# Patient Record
Sex: Male | Born: 1945 | Race: White | Hispanic: No | Marital: Married | Smoking: Former smoker
Health system: Southern US, Community
[De-identification: ages and names within clinical notes are randomized; demographics above are authoritative.]

## PROBLEM LIST (undated history)

## (undated) DIAGNOSIS — I10 Essential (primary) hypertension: Secondary | ICD-10-CM

## (undated) DIAGNOSIS — K625 Hemorrhage of anus and rectum: Secondary | ICD-10-CM

## (undated) DIAGNOSIS — J45909 Unspecified asthma, uncomplicated: Secondary | ICD-10-CM

## (undated) DIAGNOSIS — K5790 Diverticulosis of intestine, part unspecified, without perforation or abscess without bleeding: Secondary | ICD-10-CM

## (undated) DIAGNOSIS — I2510 Atherosclerotic heart disease of native coronary artery without angina pectoris: Secondary | ICD-10-CM

## (undated) DIAGNOSIS — K5792 Diverticulitis of intestine, part unspecified, without perforation or abscess without bleeding: Secondary | ICD-10-CM

## (undated) DIAGNOSIS — M1990 Unspecified osteoarthritis, unspecified site: Secondary | ICD-10-CM

## (undated) DIAGNOSIS — R7302 Impaired glucose tolerance (oral): Secondary | ICD-10-CM

## (undated) DIAGNOSIS — E785 Hyperlipidemia, unspecified: Secondary | ICD-10-CM

## (undated) HISTORY — PX: HERNIA REPAIR: SHX51

---

## 2006-03-17 ENCOUNTER — Ambulatory Visit: Payer: Self-pay | Admitting: Internal Medicine

## 2006-03-23 ENCOUNTER — Emergency Department: Payer: Self-pay | Admitting: Emergency Medicine

## 2007-02-28 ENCOUNTER — Ambulatory Visit: Payer: Self-pay | Admitting: Internal Medicine

## 2007-09-18 ENCOUNTER — Emergency Department: Payer: Self-pay | Admitting: Emergency Medicine

## 2007-10-20 ENCOUNTER — Ambulatory Visit: Payer: Self-pay | Admitting: General Surgery

## 2007-10-20 ENCOUNTER — Other Ambulatory Visit: Payer: Self-pay

## 2007-10-26 ENCOUNTER — Ambulatory Visit: Payer: Self-pay | Admitting: Gastroenterology

## 2007-11-02 ENCOUNTER — Ambulatory Visit: Payer: Self-pay | Admitting: General Surgery

## 2009-03-03 ENCOUNTER — Ambulatory Visit: Payer: Self-pay | Admitting: Internal Medicine

## 2009-08-15 ENCOUNTER — Ambulatory Visit: Payer: Self-pay | Admitting: Family Medicine

## 2011-06-22 ENCOUNTER — Emergency Department: Payer: Self-pay | Admitting: *Deleted

## 2013-04-10 ENCOUNTER — Ambulatory Visit: Payer: Self-pay | Admitting: Family Medicine

## 2014-12-23 ENCOUNTER — Ambulatory Visit (INDEPENDENT_AMBULATORY_CARE_PROVIDER_SITE_OTHER)
Admission: EM | Admit: 2014-12-23 | Discharge: 2014-12-23 | Disposition: A | Discharge status: Home / Self Care | Payer: 59 | Source: Home / Self Care

## 2014-12-23 ENCOUNTER — Inpatient Hospital Stay
Admission: EM | Admit: 2014-12-23 | Discharge: 2014-12-24 | DRG: 379 | Disposition: A | Discharge status: Other Acute Inpatient Hospital | Payer: 59 | Attending: Internal Medicine | Admitting: Internal Medicine

## 2014-12-23 ENCOUNTER — Encounter: Payer: Self-pay | Admitting: *Deleted

## 2014-12-23 DIAGNOSIS — I878 Other specified disorders of veins: Secondary | ICD-10-CM | POA: Insufficient documentation

## 2014-12-23 DIAGNOSIS — W19XXXA Unspecified fall, initial encounter: Secondary | ICD-10-CM

## 2014-12-23 DIAGNOSIS — Z8249 Family history of ischemic heart disease and other diseases of the circulatory system: Secondary | ICD-10-CM

## 2014-12-23 DIAGNOSIS — K922 Gastrointestinal hemorrhage, unspecified: Secondary | ICD-10-CM | POA: Diagnosis not present

## 2014-12-23 DIAGNOSIS — I959 Hypotension, unspecified: Secondary | ICD-10-CM | POA: Diagnosis present

## 2014-12-23 DIAGNOSIS — K625 Hemorrhage of anus and rectum: Secondary | ICD-10-CM | POA: Diagnosis not present

## 2014-12-23 DIAGNOSIS — J45909 Unspecified asthma, uncomplicated: Secondary | ICD-10-CM | POA: Diagnosis present

## 2014-12-23 DIAGNOSIS — E785 Hyperlipidemia, unspecified: Secondary | ICD-10-CM | POA: Diagnosis present

## 2014-12-23 DIAGNOSIS — Z8719 Personal history of other diseases of the digestive system: Secondary | ICD-10-CM

## 2014-12-23 DIAGNOSIS — R571 Hypovolemic shock: Secondary | ICD-10-CM

## 2014-12-23 DIAGNOSIS — R55 Syncope and collapse: Secondary | ICD-10-CM | POA: Diagnosis present

## 2014-12-23 DIAGNOSIS — Z452 Encounter for adjustment and management of vascular access device: Secondary | ICD-10-CM

## 2014-12-23 DIAGNOSIS — I2510 Atherosclerotic heart disease of native coronary artery without angina pectoris: Secondary | ICD-10-CM | POA: Diagnosis present

## 2014-12-23 DIAGNOSIS — I10 Essential (primary) hypertension: Secondary | ICD-10-CM | POA: Diagnosis present

## 2014-12-23 DIAGNOSIS — K921 Melena: Principal | ICD-10-CM | POA: Diagnosis present

## 2014-12-23 HISTORY — DX: Essential (primary) hypertension: I10

## 2014-12-23 HISTORY — DX: Hyperlipidemia, unspecified: E78.5

## 2014-12-23 HISTORY — DX: Diverticulosis of intestine, part unspecified, without perforation or abscess without bleeding: K57.90

## 2014-12-23 HISTORY — DX: Diverticulitis of intestine, part unspecified, without perforation or abscess without bleeding: K57.92

## 2014-12-23 LAB — COMPREHENSIVE METABOLIC PANEL
ALT: 38 U/L (ref 17–63)
AST: 35 U/L (ref 15–41)
Albumin: 3.7 g/dL (ref 3.5–5.0)
Alkaline Phosphatase: 47 U/L (ref 38–126)
Anion gap: 6 (ref 5–15)
BUN: 19 mg/dL (ref 6–20)
CHLORIDE: 105 mmol/L (ref 101–111)
CO2: 30 mmol/L (ref 22–32)
CREATININE: 0.85 mg/dL (ref 0.61–1.24)
Calcium: 9.4 mg/dL (ref 8.9–10.3)
GFR calc non Af Amer: >60 mL/min (ref >60)
Glucose, Bld: 117 mg/dL — ABNORMAL HIGH (ref 65–99)
Potassium: 3.9 mmol/L (ref 3.5–5.1)
SODIUM: 141 mmol/L (ref 135–145)
Total Bilirubin: 0.7 mg/dL (ref 0.3–1.2)
Total Protein: 6.1 g/dL — ABNORMAL LOW (ref 6.5–8.1)

## 2014-12-23 LAB — CBC
HCT: 40.6 % (ref 40.0–52.0)
Hemoglobin: 13.4 g/dL (ref 13.0–18.0)
MCH: 29.9 pg (ref 26.0–34.0)
MCHC: 33.1 g/dL (ref 32.0–36.0)
MCV: 90.4 fL (ref 80.0–100.0)
PLATELETS: 209 10*3/uL (ref 150–440)
RBC: 4.49 MIL/uL (ref 4.40–5.90)
RDW: 13.5 % (ref 11.5–14.5)
WBC: 12 10*3/uL — ABNORMAL HIGH (ref 3.8–10.6)

## 2014-12-23 LAB — ABO/RH: ABO/RH(D): O POS

## 2014-12-23 LAB — HEMOGLOBIN: HEMOGLOBIN: 13.1 g/dL (ref 13.0–18.0)

## 2014-12-23 MED ORDER — ADULT MULTIVITAMIN W/MINERALS CH: 1.0000 | ORAL_TABLET | ORAL | Status: DC

## 2014-12-23 MED ORDER — SODIUM CHLORIDE 0.9 % IV BOLUS (SEPSIS)
1000.0000 mL | Freq: Once | INTRAVENOUS | Status: AC
  Administered 2014-12-23: 1000 mL via INTRAVENOUS

## 2014-12-23 MED ORDER — VITAMIN C 500 MG PO TABS
1000.0000 mg | ORAL_TABLET | Freq: Every day | ORAL | Status: DC
Start: 2014-12-23 — End: 2014-12-24

## 2014-12-23 MED ORDER — ONDANSETRON HCL 4 MG/2ML IJ SOLN
4.0000 mg | Freq: Four times a day (QID) | INTRAMUSCULAR | Status: DC | PRN
  Administered 2014-12-24: 4 mg via INTRAVENOUS
  Filled 2014-12-23: qty 2

## 2014-12-23 MED ORDER — ACETAMINOPHEN 325 MG PO TABS: 650.0000 mg | ORAL_TABLET | Freq: Four times a day (QID) | ORAL | Status: DC | PRN

## 2014-12-23 MED ORDER — SODIUM CHLORIDE 0.9 % IV SOLN
INTRAVENOUS | Status: DC
  Administered 2014-12-23: 21:00:00 via INTRAVENOUS

## 2014-12-23 MED ORDER — PRAVASTATIN SODIUM 20 MG PO TABS: 20.0000 mg | ORAL_TABLET | Freq: Every day | ORAL | Status: DC

## 2014-12-23 MED ORDER — ACETAMINOPHEN 650 MG RE SUPP: 650.0000 mg | Freq: Four times a day (QID) | RECTAL | Status: DC | PRN

## 2014-12-23 MED ORDER — SODIUM CHLORIDE 0.9 % IV BOLUS (SEPSIS): 1000.0000 mL | INTRAVENOUS | Status: DC

## 2014-12-23 MED ORDER — MOMETASONE FURO-FORMOTEROL FUM 100-5 MCG/ACT IN AERO
2.0000 | INHALATION_SPRAY | Freq: Two times a day (BID) | RESPIRATORY_TRACT | Status: DC
  Filled 2014-12-23: qty 8.8

## 2014-12-23 MED ORDER — ONDANSETRON HCL 4 MG PO TABS
4.0000 mg | ORAL_TABLET | Freq: Four times a day (QID) | ORAL | Status: DC | PRN
Start: 2014-12-23 — End: 2014-12-24

## 2014-12-23 MED ORDER — ONDANSETRON HCL 4 MG/2ML IJ SOLN
4.0000 mg | Freq: Once | INTRAMUSCULAR | Status: AC
  Administered 2014-12-23: 4 mg via INTRAVENOUS

## 2014-12-23 NOTE — ED Provider Notes (Addendum)
[facility] Emergency Department Provider Note  ____________________________________________  Time seen: Approximately 6:29 PM  I have reviewed the triage vital signs and the nursing notes.   HISTORY  Chief Complaint Rectal Bleeding    HPI Shawn Lowery is a 69 y.o. male with a past medical history includes prior diverticulitis who presents with acute onset of gross blood per rectum.  He states he felt like he was in his normal state of health this afternoon and then took a brief nap.  When he awoke he felt like he was going have diarrhea so he went to the bathroom and had a large volume of blood in stool.  This happened 2 more times over a short period of time when he asked his wife to take him to the urgent care.  After he arrived at the urgent care he was being evaluated when he became very diaphoretic and reportedly became hypoxemic (it sounds like he may have had a vasovagal episode).  Transported him to [facility] by EMS.  Patient states he has had at least 8 episodes of gross bright red blood per rectum since it started this afternoon including several episodes here in the emergency department.  Onset is acute severity is severe.  He feels lightheaded and dizzy when he stands up.  He denies chest pain, shortness of breath, nausea, vomiting.  He is having intermittent cramping abdominal pain but it is currently absent and is only mild at its most severe.  He has never had Bleeding like this in the past, although his wife reports that his last endoscopy showed diverticulosis.   Past Medical History  Diagnosis Date  . Hypertension   . Hyperlipemia     There are no active problems to display for this patient.   History reviewed. No pertinent past surgical history.  Current Outpatient Rx  Name  Route  Sig  Dispense  Refill  . Fluticasone-Salmeterol (ADVAIR) 100-50 MCG/DOSE AEPB   Inhalation   Inhale 1 puff into the lungs 2 (two) times  daily.         Marland Kitchen lisinopril-hydrochlorothiazide (PRINZIDE,ZESTORETIC) 20-25 MG per tablet   Oral   Take 1 tablet by mouth daily.         Marland Kitchen lovastatin (MEVACOR) 20 MG tablet   Oral   Take 20 mg by mouth at bedtime.           Allergies Review of patient's allergies indicates no known allergies.  History reviewed. No pertinent family history.  Social History Social History  Substance Use Topics  . Smoking status: Former Research scientist (life sciences)  . Smokeless tobacco: Never Used  . Alcohol Use: No    Review of Systems Constitutional: No fever/chills Eyes: No visual changes. ENT: No sore throat. Cardiovascular: Denies chest pain. Respiratory: Denies shortness of breath. Gastrointestinal: Intermittent mild lower abdominal cramping.  No nausea, no vomiting.  Profuse diarrhea that is mostly blood  Genitourinary: Negative for dysuria. Musculoskeletal: Negative for back pain. Skin: Negative for rash. Neurological: Negative for headaches, focal weakness or numbness.  Feeling lightheaded and weak when he stands up  10-point ROS otherwise negative.  ____________________________________________   PHYSICAL EXAM:  VITAL SIGNS: ED Triage Vitals  Enc Vitals Group     BP 12/23/14 1646 130/73 mmHg     Pulse Rate 12/23/14 1646 65     Resp 12/23/14 1646 16     Temp --      Temp src --      SpO2  12/23/14 1642 96 %     Weight 12/23/14 1646 200 lb (90.719 kg)     Height 12/23/14 1646 5\' 11"  (1.803 m)     Head Cir --      Peak Flow --      Pain Score --      Pain Loc --      Pain Edu? --      Excl. in [place]? --     Constitutional: Alert and oriented. Well appearing and in no acute distress at this time. Eyes: Conjunctivae are normal. PERRL. EOMI. Head: Atraumatic. Nose: No congestion/rhinnorhea. Mouth/Throat: Mucous membranes are moist.  Oropharynx non-erythematous. Neck: No stridor.   Cardiovascular: Normal rate, regular rhythm. Grossly normal heart sounds.  Good peripheral  circulation. Respiratory: Normal respiratory effort.  No retractions. Lungs CTAB. Gastrointestinal: Soft and nontender. No distention. No abdominal bruits. No CVA tenderness. Deferred rectal exam because he had a grossly bloody bowel movement (BRBPR w/ dark clots) while in the ED. Musculoskeletal: No lower extremity tenderness nor edema.  No joint effusions. Neurologic:  Normal speech and language. No gross focal neurologic deficits are appreciated.  Skin:  Skin is warm, dry and intact. No rash noted. Psychiatric: Mood and affect are normal. Speech and behavior are normal.  ____________________________________________   LABS (all labs ordered are listed, but only abnormal results are displayed)  Labs Reviewed  COMPREHENSIVE METABOLIC PANEL - Abnormal; Notable for the following:    Glucose, Bld 117 (*)    Total Protein 6.1 (*)    All other components within normal limits  CBC - Abnormal; Notable for the following:    WBC 12.0 (*)    All other components within normal limits  TYPE AND SCREEN  ABO/RH   ____________________________________________  EKG  ED ECG REPORT I, Lucetta Baehr, the attending physician, personally viewed and interpreted this ECG.  Date: 12/23/2014 EKG Time: 16:49 Rate: 68 Rhythm: normal sinus rhythm QRS Axis: normal Intervals: normal ST/T Wave abnormalities: normal Conduction Disutrbances: none Narrative Interpretation: unremarkable  ____________________________________________  RADIOLOGY  No results found.  ____________________________________________   PROCEDURES  Procedure(s) performed: None  Critical Care performed: No ____________________________________________   INITIAL IMPRESSION / ASSESSMENT AND PLAN / ED COURSE  Pertinent labs & imaging results that were available during my care of the patient were reviewed by me and considered in my medical decision making (see chart for details).  Gross blood per rectum with lightheadedness  and dizziness.  Initial hemoglobin is normal but that is to be expected given the acute onset of the bleeding.  Patient has 2 peripheral IVs and is getting normal saline bolus.  Standard labs up and ordered including type and screen.  He is continued to have bloody bowel movements but is otherwise asymptomatic as long as he does not stand up too quickly.  We will admit for further evaluation and management.  ____________________________________________  FINAL CLINICAL IMPRESSION(S) / ED DIAGNOSES  Final diagnoses:  Acute lower GI bleeding      NEW MEDICATIONS STARTED DURING THIS VISIT:  New Prescriptions   No medications on file      Hinda Kehr, MD 12/23/14 1835   Note: 2L NS IV boluses were started at urgent care...he continued them in the ED.  Hinda Kehr, MD 12/23/14 Bosie Helper

## 2014-12-23 NOTE — ED Notes (Signed)
1000 ml NS completed

## 2014-12-23 NOTE — H&P (Signed)
[place] [facility] [place] NAME: Shawn Lowery    MR#:  [id number]  DATE OF BIRTH:  1946/02/21  DATE OF ADMISSION:  12/23/2014  PRIMARY CARE PHYSICIAN: Sallee Lange, NP   REQUESTING/REFERRING PHYSICIAN: Dr. Hinda Kehr  CHIEF COMPLAINT:   Chief Complaint  Patient presents with  . Rectal Bleeding    HISTORY OF PRESENT ILLNESS:  Shawn Lowery  is a 69 y.o. male with a known history of hypertension, hyperlipidemia, who presents to the hospital due to multiple episodes of bright red blood per rectum. Patient has never had these symptoms before. He has had about 7-8 bloody bowel movements today. Patient went to urgent care and had a presyncopal event there and therefore was referred to the ER for further evaluation. Patient denies any abdominal pain, nausea, vomiting, fever, chills or any other associated symptoms. Patient does have a history of diverticulosis and diverticulitis in the past.  PAST MEDICAL HISTORY:   Past Medical History  Diagnosis Date  . Hypertension   . Hyperlipemia   . Diverticulosis   . Diverticulitis     PAST SURGICAL HISTORY:  History reviewed. No pertinent past surgical history.  SOCIAL HISTORY:   Social History  Substance Use Topics  . Smoking status: Former Smoker -- 1.00 packs/day for 15 years  . Smokeless tobacco: Never Used  . Alcohol Use: No    FAMILY HISTORY:   Family History  Problem Relation Age of Onset  . Hypertension Father     DRUG ALLERGIES:  No Known Allergies  REVIEW OF SYSTEMS:   Review of Systems  Constitutional: Negative for fever and weight loss.  HENT: Negative for congestion, nosebleeds and tinnitus.   Eyes: Negative for blurred vision, double vision and redness.  Respiratory: Negative for cough, hemoptysis and shortness of breath.   Cardiovascular: Negative for chest pain, orthopnea, leg swelling and PND.  Gastrointestinal: Positive for blood in stool. Negative for  nausea, vomiting, abdominal pain, diarrhea and melena.  Genitourinary: Negative for dysuria, urgency and hematuria.  Musculoskeletal: Negative for joint pain and falls.  Neurological: Negative for dizziness, tingling, sensory change, focal weakness, seizures, weakness and headaches.  Endo/Heme/Allergies: Negative for polydipsia. Does not bruise/bleed easily.  Psychiatric/Behavioral: Negative for depression and memory loss. The patient is not nervous/anxious.     MEDICATIONS AT HOME:   Prior to Admission medications   Medication Sig Start Date End Date Taking? Authorizing Provider  Ascorbic Acid (VITAMIN C) 1000 MG tablet Take 1,000 mg by mouth daily.   Yes Historical Provider, MD  aspirin EC 81 MG tablet Take 81 mg by mouth daily.   Yes Historical Provider, MD  Fluticasone-Salmeterol (ADVAIR) 100-50 MCG/DOSE AEPB Inhale 1 puff into the lungs 2 (two) times daily.   Yes Historical Provider, MD  lisinopril-hydrochlorothiazide (PRINZIDE,ZESTORETIC) 20-25 MG per tablet Take 1 tablet by mouth daily.   Yes Historical Provider, MD  lovastatin (MEVACOR) 20 MG tablet Take 20 mg by mouth daily.    Yes Historical Provider, MD  Multiple Vitamin (MULTIVITAMIN WITH MINERALS) TABS tablet Take 1 tablet by mouth every Monday, Wednesday, and Friday.   Yes Historical Provider, MD      VITAL SIGNS:  Blood pressure 134/63, pulse 69, resp. rate 18, height 5\' 11"  (1.803 m), weight 90.719 kg (200 lb), SpO2 98 %.  PHYSICAL EXAMINATION:  Physical Exam  GENERAL:  69 y.o.-year-old patient lying in the bed with no acute distress.  EYES: Pupils equal, round, reactive to light and  accommodation. No scleral icterus. Extraocular muscles intact.  HEENT: Head atraumatic, normocephalic. Oropharynx and nasopharynx clear. No oropharyngeal erythema, moist oral mucosa  NECK:  Supple, no jugular venous distention. No thyroid enlargement, no tenderness.  LUNGS: Normal breath sounds bilaterally, no wheezing, rales, rhonchi. No  use of accessory muscles of respiration.  CARDIOVASCULAR: S1, S2 RRR. No murmurs, rubs, gallops, clicks.  ABDOMEN: Soft, nontender, nondistended. Bowel sounds present. No organomegaly or mass.  EXTREMITIES: No pedal edema, cyanosis, or clubbing. + 2 pedal & radial pulses b/l.   NEUROLOGIC: Cranial nerves II through XII are intact. No focal Motor or sensory deficits appreciated b/l PSYCHIATRIC: The patient is alert and oriented x 3. Good affect.  SKIN: No obvious rash, lesion, or ulcer.   LABORATORY PANEL:   CBC  Recent Labs Lab 12/23/14 1658  WBC 12.0*  HGB 13.4  HCT 40.6  PLT 209   ------------------------------------------------------------------------------------------------------------------  Chemistries   Recent Labs Lab 12/23/14 1658  NA 141  K 3.9  CL 105  CO2 30  GLUCOSE 117*  BUN 19  CREATININE 0.85  CALCIUM 9.4  AST 35  ALT 38  ALKPHOS 47  BILITOT 0.7   ------------------------------------------------------------------------------------------------------------------  Cardiac Enzymes No results for input(s): TROPONINI in the last 168 hours. ------------------------------------------------------------------------------------------------------------------  RADIOLOGY:  No results found.   IMPRESSION AND PLAN:   69 year old male with past medical history of hypertension, hyperlipidemia, history of diverticulosis, asthma, history of coronary disease, who presents to the hospital due to multiple episodes of rectal bleeding.  #1 GI bleed-likely a lower GI bleed given his hematochezia. -His hemoglobin although is stable. He is hemodynamically stable. -We'll observe overnight, follow serial hemoglobin. Most likely cause of this is diverticular given his history. -Gastroenterology consult. Hold aspirin. Transfuse if needed.  #2 hypertension-hold antihypertensives given the patient's GI bleed.  #3 hyperlipidemia-continue Pravachol  #4 asthma-continue  Symbicort.   All the records are reviewed and case discussed with ED provider. Management plans discussed with the patient, family and they are in agreement.  CODE STATUS: Full  TOTAL TIME TAKING CARE OF THIS PATIENT: 45 minutes.    Henreitta Leber M.D on 12/23/2014 at 8:26 PM  Between 7am to 6pm - Pager - [phone]  After 6pm go to [email or web address] - password [email or web address] [facility]  Office  [phone]  CC: Primary care physician; Sallee Lange, NP

## 2014-12-23 NOTE — ED Notes (Signed)
Dr. Alveta Heimlich request that EMS be called as patient extremely diaphoretic and pale in color.

## 2014-12-23 NOTE — Discharge Instructions (Signed)
Gastrointestinal Bleeding Gastrointestinal bleeding is bleeding somewhere along the path that food travels through the body (digestive tract). This path is anywhere between the mouth and the opening of the butt (anus). You may have blood in your throw up (vomit) or in your poop (stools). If there is a lot of bleeding, you may need to stay in the hospital. [place]  Only take medicine as told by your doctor.  Eat foods with fiber such as whole grains, fruits, and vegetables. You can also try eating 1 to 3 prunes a day.  Drink enough fluids to keep your pee (urine) clear or pale yellow. GET HELP RIGHT AWAY IF:   Your bleeding gets worse.  You feel dizzy, weak, or you pass out (faint).  You have bad cramps in your back or belly (abdomen).  You have large blood clumps (clots) in your poop.  Your problems are getting worse. MAKE SURE YOU:   Understand these instructions.  Will watch your condition.  Will get help right away if you are not doing well or get worse. Document Released: 01/23/2008 Document Revised: 04/01/2012 Document Reviewed: 03/25/2011 [facility] Patient Information 2015 [place], [place]. This information is not intended to replace advice given to you by your health care provider. Make sure you discuss any questions you have with your health care provider.  Rectal Bleeding  Rectal bleeding is when blood comes out of the opening of the butt (anus). Rectal bleeding may show up as bright red blood or really dark poop (stool). The poop may look dark red, maroon, or black. Rectal bleeding is often a sign that something is wrong. This needs to be checked by a doctor.  HOME CARE  Eat a diet high in fiber. This will help keep your poop soft.  Limit activity.  Drink enough fluids to keep your pee (urine) clear or pale yellow.  Take a warm bath to soothe any pain.  Follow up with your doctor as told. GET HELP RIGHT AWAY IF:  You have more bleeding.  You have black or dark  red poop.  You throw up (vomit) blood or it looks like coffee grounds.  You have belly (abdominal) pain or tenderness.  You have a fever.  You feel weak, sick to your stomach (nauseous), or you pass out (faint).  You have pain that is so bad you cannot poop (bowel movement). MAKE SURE YOU:  Understand these instructions.  Will watch your condition.  Will get help right away if you are not doing well or get worse. Document Released: 12/26/2010 Document Revised: 08/30/2013 Document Reviewed: 12/26/2010 [facility] Patient Information 2015 [place], [place]. This information is not intended to replace advice given to you by your health care provider. Make sure you discuss any questions you have with your health care provider.

## 2014-12-23 NOTE — ED Notes (Signed)
Patient take by EMS to [facility] ED.  Dr. Alveta Heimlich called report to charge nurse at [facility] ED.

## 2014-12-23 NOTE — ED Notes (Signed)
EMS recalled as patient status decline, more diaphoretic, extremely pale, and lethargic.

## 2014-12-23 NOTE — ED Notes (Signed)
Pt sent from [facility] Urgent care, c/o rectal bleeding.  Urgent care started 2 PIV, with NS 2L, Pt had near syncopal episode.

## 2014-12-23 NOTE — ED Notes (Signed)
Pt states "I started bleeding from my rectum about 30 mins ago." Dr. Alveta Heimlich called to bedside. Pt states "I do take an Asprin and usually have high blood pressure. I do take blood pressure medication."

## 2014-12-23 NOTE — ED Provider Notes (Signed)
CSN: [id number]     Arrival date & time 12/23/14  1519 History   None    Chief Complaint  Patient presents with  . Rectal Bleeding   (Consider location/radiation/quality/duration/timing/severity/associated sxs/prior Treatment) Patient is a 69 y.o. male presenting with hematochezia. The history is provided by the patient and the spouse. No language interpreter was used.  Rectal Bleeding Quality:  Bright red Amount:  Copious Timing:  Intermittent Progression:  Worsening Chronicity:  New Context: defecation and spontaneously   Context: not diarrhea, not foreign body, not hemorrhoids, not rectal injury and not rectal pain   Similar prior episodes: no   Relieved by:  Nothing Worsened by:  Defecation Ineffective treatments:  None tried Associated symptoms: dizziness and light-headedness   Associated symptoms: no abdominal pain, no epistaxis, no fever, no hematemesis, no loss of consciousness, no recent illness and no vomiting   Associated symptoms comment:  Patient does have nausea. Risk factors: NSAID use   Risk factors: no anticoagulant use, no hx of colorectal cancer, no hx of colorectal surgery and no hx of IBD      Patient is a 69 year old white male who presented to the urgent care course rectal bleeding at home that started about an hour ago. We initially checked in his bathroom for about 20 minutes when they checked on him and found blood all the bathroom he was alert and coherent but is stable to the back with the nurse came and got me. Patient reports last colonoscopy was about 9 years ago and he does have a history of diverticulitis states that using occurs he'll have some abdominal pain as well. Takes baby aspirin but denies being on any blood thinners Otherwise.    No past medical history on file. No past surgical history on file. No family history on file. Social History  Substance Use Topics  . Smoking status: Not on file  . Smokeless tobacco: Not on file  .  Alcohol Use: Not on file    Review of Systems  Constitutional: Positive for diaphoresis, activity change and fatigue. Negative for fever.  HENT: Negative for nosebleeds.   Respiratory: Negative for apnea, chest tightness and shortness of breath.   Cardiovascular: Negative for chest pain and palpitations.  Gastrointestinal: Positive for nausea, diarrhea, blood in stool, hematochezia and anal bleeding. Negative for vomiting, abdominal pain, rectal pain and hematemesis.  Neurological: Positive for dizziness and light-headedness. Negative for loss of consciousness.  All other systems reviewed and are negative.   Allergies  Review of patient's allergies indicates not on file.  Home Medications   Prior to Admission medications   Medication Sig Start Date End Date Taking? Authorizing Provider  Fluticasone-Salmeterol (ADVAIR) 100-50 MCG/DOSE AEPB Inhale 1 puff into the lungs 2 (two) times daily.   Yes Historical Provider, MD  lisinopril-hydrochlorothiazide (PRINZIDE,ZESTORETIC) 20-25 MG per tablet Take 1 tablet by mouth daily.   Yes Historical Provider, MD  lovastatin (MEVACOR) 20 MG tablet Take 20 mg by mouth at bedtime.   Yes Historical Provider, MD   Meds Ordered and Administered this Visit   Medications  sodium chloride 0.9 % bolus 1,000 mL (1,000 mLs Intravenous Given 12/23/14 1758)  sodium chloride 0.9 % bolus 1,000 mL (1,000 mLs Intravenous Given 12/23/14 1755)  ondansetron (ZOFRAN) injection 4 mg (4 mg Intravenous Given 12/23/14 1600)  According to his wife he does not smoke.  BP 114/94 mmHg  Pulse 64  Temp(Src) 98.4 F (36.9 C) (Oral)  Resp 24  SpO2 100% No data found.  Physical Exam  Constitutional: He appears well-developed and well-nourished. He is not intubated.  HENT:  Head: Normocephalic and atraumatic.  Eyes: Pupils are equal, round, and reactive to light.  Neck: Normal range of motion. Neck supple.  Cardiovascular: Regular rhythm and S1 normal.  Tachycardia  present.   Pulmonary/Chest: Accessory muscle usage present. He is not intubated. No respiratory distress. He has decreased breath sounds. He has no wheezes. He has no rhonchi.  Patient initially noted to have a low pulse ox 90-93 he was started on 2 L and then eventually moved 4 L. While dictating a nonrebreathing mask and IV fluids started and pulse ox improved and patient was able to wean down to 94 L when time EMS came  Abdominal: Soft. Normal appearance. Bowel sounds are decreased. There is no hepatosplenomegaly. There is tenderness in the left lower quadrant. There is no CVA tenderness.    Skin: Skin is intact. No rash noted. He is diaphoretic.  Psychiatric: He has a normal mood and affect. His behavior is normal. His mood appears not anxious. His speech is not delayed and not slurred. He is not agitated, not aggressive and not slowed. Cognition and memory are normal.    ED Course  Procedures (including critical care time)  Labs Review Labs Reviewed - No data to display  Imaging Review No results found.   Visual Acuity Review  Right Eye Distance:   Left Eye Distance:   Bilateral Distance:    Right Eye Near:   Left Eye Near:    Bilateral Near:         MDM   1. Hypovolemic shock   2. Rectal bleeding   3. Hx of diverticulitis of colon    While patient was being evaluated he became diaphoretic, pulse ox dropped, and he became tachycardic. Two lines were started as well as O2 and fluids were pushed in. Patient became more alert blood pressure improved and pulse rate and oxygenation also improved. EMS arrived and transported patient to [facility]. Discharge disc regular was given reports During this time Dr. Alveta Heimlich stated patient's bedside until arrival of EMS.     Frederich Cha, MD 12/23/14 [phone]

## 2014-12-24 ENCOUNTER — Other Ambulatory Visit: Payer: Self-pay | Admitting: Internal Medicine

## 2014-12-24 ENCOUNTER — Other Ambulatory Visit: Payer: Self-pay

## 2014-12-24 ENCOUNTER — Observation Stay: Payer: 59

## 2014-12-24 ENCOUNTER — Ambulatory Visit (HOSPITAL_COMMUNITY)
Admission: RE | Admit: 2014-12-24 | Discharge: 2014-12-24 | Disposition: A | Discharge status: Home / Self Care | Payer: 59 | Source: Ambulatory Visit | Attending: Internal Medicine | Admitting: Internal Medicine

## 2014-12-24 ENCOUNTER — Inpatient Hospital Stay: Payer: 59

## 2014-12-24 ENCOUNTER — Encounter (HOSPITAL_COMMUNITY): Payer: Self-pay | Admitting: *Deleted

## 2014-12-24 ENCOUNTER — Inpatient Hospital Stay (HOSPITAL_COMMUNITY)
Admission: AD | Admit: 2014-12-24 | Discharge: 2014-12-27 | DRG: 377 | Disposition: A | Discharge status: Home / Self Care | Payer: 59 | Source: Other Acute Inpatient Hospital | Attending: Internal Medicine | Admitting: Internal Medicine

## 2014-12-24 DIAGNOSIS — J45909 Unspecified asthma, uncomplicated: Secondary | ICD-10-CM | POA: Diagnosis present

## 2014-12-24 DIAGNOSIS — K921 Melena: Secondary | ICD-10-CM | POA: Diagnosis present

## 2014-12-24 DIAGNOSIS — E785 Hyperlipidemia, unspecified: Secondary | ICD-10-CM | POA: Diagnosis present

## 2014-12-24 DIAGNOSIS — K5791 Diverticulosis of intestine, part unspecified, without perforation or abscess with bleeding: Principal | ICD-10-CM | POA: Diagnosis present

## 2014-12-24 DIAGNOSIS — Z87891 Personal history of nicotine dependence: Secondary | ICD-10-CM

## 2014-12-24 DIAGNOSIS — K5731 Diverticulosis of large intestine without perforation or abscess with bleeding: Secondary | ICD-10-CM | POA: Diagnosis present

## 2014-12-24 DIAGNOSIS — K922 Gastrointestinal hemorrhage, unspecified: Secondary | ICD-10-CM | POA: Insufficient documentation

## 2014-12-24 DIAGNOSIS — J328 Other chronic sinusitis: Secondary | ICD-10-CM | POA: Diagnosis not present

## 2014-12-24 DIAGNOSIS — I959 Hypotension, unspecified: Secondary | ICD-10-CM | POA: Diagnosis present

## 2014-12-24 DIAGNOSIS — D500 Iron deficiency anemia secondary to blood loss (chronic): Secondary | ICD-10-CM | POA: Diagnosis not present

## 2014-12-24 DIAGNOSIS — I10 Essential (primary) hypertension: Secondary | ICD-10-CM | POA: Diagnosis present

## 2014-12-24 DIAGNOSIS — N400 Enlarged prostate without lower urinary tract symptoms: Secondary | ICD-10-CM | POA: Diagnosis not present

## 2014-12-24 DIAGNOSIS — I878 Other specified disorders of veins: Secondary | ICD-10-CM | POA: Insufficient documentation

## 2014-12-24 DIAGNOSIS — D62 Acute posthemorrhagic anemia: Secondary | ICD-10-CM | POA: Diagnosis present

## 2014-12-24 DIAGNOSIS — N401 Enlarged prostate with lower urinary tract symptoms: Secondary | ICD-10-CM | POA: Diagnosis present

## 2014-12-24 DIAGNOSIS — Z7982 Long term (current) use of aspirin: Secondary | ICD-10-CM

## 2014-12-24 DIAGNOSIS — R338 Other retention of urine: Secondary | ICD-10-CM | POA: Diagnosis present

## 2014-12-24 DIAGNOSIS — I7779 Dissection of other artery: Secondary | ICD-10-CM | POA: Diagnosis present

## 2014-12-24 DIAGNOSIS — K625 Hemorrhage of anus and rectum: Secondary | ICD-10-CM | POA: Diagnosis present

## 2014-12-24 DIAGNOSIS — Z8249 Family history of ischemic heart disease and other diseases of the circulatory system: Secondary | ICD-10-CM | POA: Diagnosis not present

## 2014-12-24 DIAGNOSIS — I2510 Atherosclerotic heart disease of native coronary artery without angina pectoris: Secondary | ICD-10-CM | POA: Diagnosis present

## 2014-12-24 DIAGNOSIS — R55 Syncope and collapse: Secondary | ICD-10-CM | POA: Diagnosis present

## 2014-12-24 LAB — COMPREHENSIVE METABOLIC PANEL
ALBUMIN: 2.2 g/dL — AB (ref 3.5–5.0)
ALK PHOS: 30 U/L — AB (ref 38–126)
ALT: 26 U/L (ref 17–63)
AST: 25 U/L (ref 15–41)
Anion gap: 3 — ABNORMAL LOW (ref 5–15)
BILIRUBIN TOTAL: 1.2 mg/dL (ref 0.3–1.2)
BUN: 18 mg/dL (ref 6–20)
CO2: 25 mmol/L (ref 22–32)
Calcium: 7 mg/dL — ABNORMAL LOW (ref 8.9–10.3)
Chloride: 113 mmol/L — ABNORMAL HIGH (ref 101–111)
Creatinine, Ser: 1.17 mg/dL (ref 0.61–1.24)
GFR calc Af Amer: >60 mL/min (ref >60)
GFR calc non Af Amer: >60 mL/min (ref >60)
GLUCOSE: 134 mg/dL — AB (ref 65–99)
POTASSIUM: 4.7 mmol/L (ref 3.5–5.1)
Sodium: 141 mmol/L (ref 135–145)
TOTAL PROTEIN: 3.8 g/dL — AB (ref 6.5–8.1)

## 2014-12-24 LAB — RAPID URINE DRUG SCREEN, HOSP PERFORMED
AMPHETAMINES: NOT DETECTED
BENZODIAZEPINES: POSITIVE — AB
Barbiturates: NOT DETECTED
COCAINE: NOT DETECTED
OPIATES: NOT DETECTED
Tetrahydrocannabinol: NOT DETECTED

## 2014-12-24 LAB — CBC
HCT: 29.3 % — ABNORMAL LOW (ref 39.0–52.0)
HEMATOCRIT: 37.5 % — AB (ref 40.0–52.0)
HEMATOCRIT: 25.5 % — AB (ref 39.0–52.0)
HEMOGLOBIN: 9.9 g/dL — AB (ref 13.0–17.0)
HEMOGLOBIN: 8.8 g/dL — AB (ref 13.0–17.0)
Hemoglobin: 12.5 g/dL — ABNORMAL LOW (ref 13.0–18.0)
MCH: 30.2 pg (ref 26.0–34.0)
MCH: 30.7 pg (ref 26.0–34.0)
MCH: 30.6 pg (ref 26.0–34.0)
MCHC: 33.3 g/dL (ref 32.0–36.0)
MCHC: 33.8 g/dL (ref 30.0–36.0)
MCHC: 34.5 g/dL (ref 30.0–36.0)
MCV: 90.9 fL (ref 80.0–100.0)
MCV: 90.7 fL (ref 78.0–100.0)
MCV: 88.5 fL (ref 78.0–100.0)
PLATELETS: 186 10*3/uL (ref 150–440)
Platelets: 154 10*3/uL (ref 150–400)
Platelets: 154 10*3/uL (ref 150–400)
RBC: 4.12 MIL/uL — AB (ref 4.40–5.90)
RBC: 3.23 MIL/uL — AB (ref 4.22–5.81)
RBC: 2.88 MIL/uL — AB (ref 4.22–5.81)
RDW: 13.8 % (ref 11.5–14.5)
RDW: 14.2 % (ref 11.5–15.5)
RDW: 15.2 % (ref 11.5–15.5)
WBC: 7.6 10*3/uL (ref 3.8–10.6)
WBC: 14.3 10*3/uL — AB (ref 4.0–10.5)
WBC: 11 10*3/uL — AB (ref 4.0–10.5)

## 2014-12-24 LAB — POCT I-STAT 3, ART BLOOD GAS (G3+)
ACID-BASE DEFICIT: 5 mmol/L — AB (ref 0.0–2.0)
BICARBONATE: 21 meq/L (ref 20.0–24.0)
O2 SAT: 94 %
PCO2 ART: 42.3 mmHg (ref 35.0–45.0)
PO2 ART: 79 mmHg — AB (ref 80.0–100.0)
Patient temperature: 98.6
TCO2: 22 mmol/L (ref 0–100)
pH, Arterial: 7.303 — ABNORMAL LOW (ref 7.350–7.450)

## 2014-12-24 LAB — APTT
APTT: 27 s (ref 24–37)
aPTT: 24 seconds (ref 24–36)

## 2014-12-24 LAB — MRSA PCR SCREENING: MRSA by PCR: NEGATIVE

## 2014-12-24 LAB — GLUCOSE, CAPILLARY: GLUCOSE-CAPILLARY: 121 mg/dL — AB (ref 65–99)

## 2014-12-24 LAB — PROTIME-INR
INR: 1.16
INR: 1.42 (ref 0.00–1.49)
Prothrombin Time: 15 seconds (ref 11.4–15.0)
Prothrombin Time: 17.4 seconds — ABNORMAL HIGH (ref 11.6–15.2)

## 2014-12-24 LAB — BASIC METABOLIC PANEL
Anion gap: 6 (ref 5–15)
BUN: 17 mg/dL (ref 6–20)
CHLORIDE: 105 mmol/L (ref 101–111)
CO2: 29 mmol/L (ref 22–32)
Calcium: 8.7 mg/dL — ABNORMAL LOW (ref 8.9–10.3)
Creatinine, Ser: 0.67 mg/dL (ref 0.61–1.24)
GFR calc non Af Amer: >60 mL/min (ref >60)
Glucose, Bld: 112 mg/dL — ABNORMAL HIGH (ref 65–99)
POTASSIUM: 3.7 mmol/L (ref 3.5–5.1)
Sodium: 140 mmol/L (ref 135–145)

## 2014-12-24 LAB — CORTISOL: CORTISOL PLASMA: 24.1 ug/dL

## 2014-12-24 LAB — PROCALCITONIN

## 2014-12-24 LAB — TROPONIN I: Troponin I: <0.03 ng/mL (ref <0.031)

## 2014-12-24 LAB — HEMOGLOBIN: HEMOGLOBIN: 10.1 g/dL — AB (ref 13.0–18.0)

## 2014-12-24 LAB — PHOSPHORUS: Phosphorus: 3.1 mg/dL (ref 2.5–4.6)

## 2014-12-24 LAB — MAGNESIUM: Magnesium: 1.5 mg/dL — ABNORMAL LOW (ref 1.7–2.4)

## 2014-12-24 LAB — LACTIC ACID, PLASMA: Lactic Acid, Venous: 1.6 mmol/L (ref 0.5–2.0)

## 2014-12-24 LAB — PREPARE RBC (CROSSMATCH)

## 2014-12-24 MED ORDER — DEXTROSE 5 % IV SOLN: 0.0000 ug/min | INTRAVENOUS | Status: DC

## 2014-12-24 MED ORDER — FENTANYL CITRATE (PF) 100 MCG/2ML IJ SOLN
INTRAMUSCULAR | Status: AC | PRN
  Administered 2014-12-24: 50 ug via INTRAVENOUS

## 2014-12-24 MED ORDER — LIDOCAINE HCL 1 % IJ SOLN
INTRAMUSCULAR | Status: AC
  Administered 2014-12-24: 14:00:00
  Filled 2014-12-24: qty 20

## 2014-12-24 MED ORDER — SODIUM CHLORIDE 0.9 % IV SOLN: Freq: Once | INTRAVENOUS | Status: DC

## 2014-12-24 MED ORDER — SODIUM CHLORIDE 0.9 % IV BOLUS (SEPSIS): 500.0000 mL | Freq: Once | INTRAVENOUS | Status: DC

## 2014-12-24 MED ORDER — PNEUMOCOCCAL VAC POLYVALENT 25 MCG/0.5ML IJ INJ
0.5000 mL | INJECTION | INTRAMUSCULAR | Status: DC
Start: 2014-12-25 — End: 2014-12-27
  Filled 2014-12-24: qty 0.5

## 2014-12-24 MED ORDER — ACETAMINOPHEN 325 MG PO TABS: 650.0000 mg | ORAL_TABLET | ORAL | Status: DC | PRN

## 2014-12-24 MED ORDER — NOREPINEPHRINE BITARTRATE 1 MG/ML IV SOLN
5.0000 ug/min | INTRAVENOUS | Status: DC
  Filled 2014-12-24: qty 4

## 2014-12-24 MED ORDER — NOREPINEPHRINE 4 MG/250ML-% IV SOLN
0.0000 ug/min | INTRAVENOUS | Status: DC
  Filled 2014-12-24: qty 250

## 2014-12-24 MED ORDER — SODIUM CHLORIDE 0.9 % IV SOLN
INTRAVENOUS | Status: DC
  Administered 2014-12-24 (×2): via INTRAVENOUS

## 2014-12-24 MED ORDER — POLYETHYLENE GLYCOL 3350 17 G PO PACK
17.0000 g | PACK | Freq: Three times a day (TID) | ORAL | Status: DC
  Administered 2014-12-24 – 2014-12-27 (×8): 17 g via ORAL
  Filled 2014-12-24 (×10): qty 1

## 2014-12-24 MED ORDER — IOHEXOL 300 MG/ML  SOLN
300.0000 mL | Freq: Once | INTRAMUSCULAR | Status: DC | PRN
  Administered 2014-12-24: 150 mL via INTRA_ARTERIAL
  Filled 2014-12-24: qty 300

## 2014-12-24 MED ORDER — TECHNETIUM TC 99M-LABELED RED BLOOD CELLS IV KIT
23.3000 | PACK | Freq: Once | INTRAVENOUS | Status: AC | PRN
  Administered 2014-12-24: 23.3 via INTRAVENOUS

## 2014-12-24 MED ORDER — INFLUENZA VAC SPLIT QUAD 0.5 ML IM SUSY
0.5000 mL | PREFILLED_SYRINGE | INTRAMUSCULAR | Status: DC
  Filled 2014-12-24: qty 0.5

## 2014-12-24 MED ORDER — MIDAZOLAM HCL 2 MG/2ML IJ SOLN
INTRAMUSCULAR | Status: AC
  Administered 2014-12-24: 14:00:00
  Filled 2014-12-24: qty 4

## 2014-12-24 MED ORDER — SODIUM CHLORIDE 0.9 % IV BOLUS (SEPSIS)
1000.0000 mL | Freq: Once | INTRAVENOUS | Status: AC
  Administered 2014-12-24: 1000 mL via INTRAVENOUS

## 2014-12-24 MED ORDER — FENTANYL CITRATE (PF) 100 MCG/2ML IJ SOLN
INTRAMUSCULAR | Status: AC
  Administered 2014-12-24: 14:00:00
  Filled 2014-12-24: qty 4

## 2014-12-24 MED ORDER — PANTOPRAZOLE SODIUM 40 MG IV SOLR
40.0000 mg | Freq: Two times a day (BID) | INTRAVENOUS | Status: DC
  Administered 2014-12-24 (×2): 40 mg via INTRAVENOUS
  Filled 2014-12-24 (×4): qty 40

## 2014-12-24 MED ORDER — MIDAZOLAM HCL 2 MG/2ML IJ SOLN
INTRAMUSCULAR | Status: AC | PRN
  Administered 2014-12-24: 1 mg via INTRAVENOUS

## 2014-12-24 NOTE — Sedation Documentation (Signed)
Patient denies pain and is resting comfortably.  

## 2014-12-24 NOTE — Consult Note (Signed)
[facility] CONSULT Reason for consult: G.I. bleeding Referring Physician: CCM. Primary care physician Ms Dayton Martes, NP. Primary G.I.: unassigned has gastroenterologist in [place],  Shawn Lowery is an 69 y.o. male.  HPI: He lives in [place] and receives his care there. He has had devious colonoscopies last about 8 years ago and was told that he needed to have this repeated in 10 years. He has no family history of colon cancer. He has had previous episodes of diverticulitis and has been treated several times with oral antibiotics. This reason he is somewhat careful about eating seeds and does take stool softener's on a regular basis to keep his stool soft. Yesterday he presented with acute onset of rectal bleeding and was admitted to [place]. A G.I. bleeding scan showed bleeding the left colon and what was felt to be the descending colon. He was transferred to [facility] in mesenteric angiogram was performed with plans for embolization for active bleeding but he had no active bleeding at the time of the angiogram. He is not passed any more bloody stool for several hours. The mentoring creatinine were normal and he has had no prior history of ulcers in his net had no upper abdominal pain or preceding melena or medication. His hemoglobin was 13.1 upon presentation to [facility] in his drop to 9.9. He remains pain-free. His only other health problems are hypertension.  Past Medical History  Diagnosis Date  . Hypertension   . Hyperlipemia   . Diverticulosis   . Diverticulitis     No past surgical history on file.  Family History  Problem Relation Age of Onset  . Hypertension Father     Social History:  reports that he has quit smoking. He has never used smokeless tobacco. He reports that he does not drink alcohol or use illicit drugs.  Allergies: No Known Allergies  Medications; Prior to Admission medications   Medication Sig Start Date End Date Taking? Authorizing  Provider  Ascorbic Acid (VITAMIN C) 1000 MG tablet Take 1,000 mg by mouth daily.   Yes Historical Provider, MD  Fluticasone-Salmeterol (ADVAIR) 100-50 MCG/DOSE AEPB Inhale 1 puff into the lungs 2 (two) times daily as needed (for wheezing or shortness of breath).    Yes Historical Provider, MD  lisinopril-hydrochlorothiazide (PRINZIDE,ZESTORETIC) 20-25 MG per tablet Take 1 tablet by mouth daily.   Yes Historical Provider, MD  lovastatin (MEVACOR) 20 MG tablet Take 20 mg by mouth daily.    Yes Historical Provider, MD  Multiple Vitamin (MULTIVITAMIN WITH MINERALS) TABS tablet Take 1 tablet by mouth every Monday, Wednesday, and Friday.   Yes Historical Provider, MD  naproxen (NAPROSYN) 500 MG tablet Take 500 mg by mouth daily.   Yes Historical Provider, MD  sodium chloride (OCEAN) 0.65 % SOLN nasal spray Place 1 spray into both nostrils as needed for congestion.   Yes Historical Provider, MD   . pantoprazole (PROTONIX) IV  40 mg Intravenous Q12H  . sodium chloride  500 mL Intravenous Once   PRN Meds acetaminophen Results for orders placed or performed during the hospital encounter of 12/24/14 (from the past 48 hour(s))  CBC     Status: Abnormal   Collection Time: 12/24/14  3:15 PM  Result Value Ref Range   WBC 14.3 (H) 4.0 - 10.5 K/uL   RBC 3.23 (L) 4.22 - 5.81 MIL/uL   Hemoglobin 9.9 (L) 13.0 - 17.0 g/dL   HCT 29.3 (L) 39.0 - 52.0 %   MCV 90.7 78.0 - 100.0  fL   MCH 30.7 26.0 - 34.0 pg   MCHC 33.8 30.0 - 36.0 g/dL   RDW 14.2 11.5 - 15.5 %   Platelets 154 150 - 400 K/uL  Protime-INR     Status: Abnormal   Collection Time: 12/24/14  3:15 PM  Result Value Ref Range   Prothrombin Time 17.4 (H) 11.6 - 15.2 seconds   INR 1.42 0.00 - 1.49  APTT     Status: None   Collection Time: 12/24/14  3:15 PM  Result Value Ref Range   aPTT 27 24 - 37 seconds  I-STAT 3, arterial blood gas (G3+)     Status: Abnormal   Collection Time: 12/24/14  3:49 PM  Result Value Ref Range   pH, Arterial 7.303 (L)  7.350 - 7.450   pCO2 arterial 42.3 35.0 - 45.0 mmHg   pO2, Arterial 79.0 (L) 80.0 - 100.0 mmHg   Bicarbonate 21.0 20.0 - 24.0 mEq/L   TCO2 22 0 - 100 mmol/L   O2 Saturation 94.0 %   Acid-base deficit 5.0 (H) 0.0 - 2.0 mmol/L   Patient temperature 98.6 F    Collection site RADIAL, ALLEN'S TEST ACCEPTABLE    Sample type ARTERIAL     Ct Head Wo Contrast  12/24/2014   CLINICAL DATA:  Admitted for acute lower GI bleed, now with syncopal episode.  EXAM: CT HEAD WITHOUT CONTRAST  TECHNIQUE: Contiguous axial images were obtained from the base of the skull through the vertex without intravenous contrast.  COMPARISON:  None.  FINDINGS: Regional soft tissues appear normal. No radiopaque foreign body. No definite displaced calvarial fracture.  There is mild atrophy with mild diffuse sulcal prominence. Gray-white differentiation is maintained. No CT evidence of acute large territory infarct. No intraparenchymal or extra-axial mass or hemorrhage. Normal size and configuration of the ventricles and basilar cisterns. No midline shift.  There is mild mucosal thickening involving the bilateral frontal sinuses as well as the anterior and posterior ethmoidal air cells. Polypoid mucosal thickening of the left maxillary sinus. No air-fluid levels. Mastoid air cells are normally aerated.  IMPRESSION: 1. Mild atrophy without acute intracranial process. 2. Sinus disease as above.  No air-fluid levels.   Electronically Signed   By: Sandi Mariscal M.D.   On: 12/24/2014 09:40   Nm Gi Blood Loss  12/24/2014   CLINICAL DATA:  Active GI bleeding for 2 days, history diverticulitis, syncopal episodes, hypotensive, history hypertension  EXAM: NUCLEAR MEDICINE GASTROINTESTINAL BLEEDING SCAN  TECHNIQUE: Sequential abdominal images were obtained following intravenous administration of Tc-29m labeled red blood cells.  RADIOPHARMACEUTICALS:  23.3 mCi Tc-73m in-vitro labeled autologous red cells.  COMPARISON:  None; correlation made with CT  abdomen 08/15/2009  FINDINGS: Normal initial blood pool distribution of tracer.  Within the first 10 minutes, abnormal GI tracer localization is seen in the lateral LEFT abdomen.  This tracer descends along the expected course of the descending colon into sigmoid colon.  This most likely represents a site of active GI bleeding at the proximal descending colon.  Later images demonstrate localization of a moderate amount of tracer within a loop in the lateral LEFT abdomen, suspect retrograde tracer passage into a redundant distal transverse colon.  IMPRESSION: Positive GI bleeding exam for a site of active GI bleeding at the proximal descending colon.  Findings called to [place] in ICU on 12/24/2014 at 1120 hours.   Electronically Signed   By: Lavonia Dana M.D.   On: 12/24/2014 11:21   Ir Angiogram  Visceral Selective  12/24/2014   CLINICAL DATA:  Acute lower GI bleeding by nuclear medicine exam involving the proximal descending colon.  EXAM: ULTRASOUND GUIDANCE FOR VASCULAR ACCESS  CELIAC, SMA, AND IMA CATHETERIZATIONS AND ANGIOGRAMS  Date:  8/27/20168/27/2016 2:58 pm  Radiologist:  M. Daryll Brod, MD  Guidance:  ULTRASOUND FLUOROSCOPIC  FLUOROSCOPY TIME:  9 MINUTES 36 SECONDS, 1,024 MGY  MEDICATIONS AND MEDICAL HISTORY: 1 mg Versed, 50 mcg fentanyl  ANESTHESIA/SEDATION: 30 minutes  CONTRAST:  1109mL OMNIPAQUE IOHEXOL 300 MG/ML  SOLN  COMPLICATIONS: None immediate  PROCEDURE: Informed consent was obtained from the patient following explanation of the procedure, risks, benefits and alternatives. The patient understands, agrees and consents for the procedure. All questions were addressed. A time out was performed.  Maximal barrier sterile technique utilized including caps, mask, sterile gowns, sterile gloves, large sterile drape, hand hygiene, and ChloraPrep.  Under sterile conditions and local anesthesia, ultrasound micropuncture access performed of the right common femoral artery. Five French sheath inserted over a  Bentson guidewire. C2 catheter advanced over guidewire and utilized initially to select the celiac artery. Selective celiac angiogram performed.  Celiac: Celiac origin is patent. Splenic, left gastric and hepatic vasculature are patent. Gastroduodenal artery patent. No active upper GI tract bleeding demonstrated.  Catheter was retracted and utilized to select the SMA origin. Selective SMA angiogram performed.  SMA: SMA origin is patent. Main SMA trunk is patent. Jejunal and colic branches appear patent. No evidence of active bleeding.  Catheter was retracted and exchanged for a Sos Omni select catheter. This catheter was utilized to select the IMA origin.  IMA: IMA main trunk is patent. The left colic and superior hemorrhoidal branches are all patent. No evidence of active bleeding.  Multiple attempts were made to access the left colic branches with a micro catheter and micro guidewire from the IMA origin. However the catheter and guidewire would not easily advance peripherally into the IMA. Repeat injection of the IMA demonstrates a small proximal IMA dissection. This is minimally flow limiting. No thrombus. Because of this, the procedure was stopped.  IMPRESSION: Successful celiac, SMA and IMA angiograms without evidence of active colonic lower GI bleeding.  Minor proximal IMA dissection related to catheter and guidewire manipulation without occlusion.   Electronically Signed   By: Jerilynn Mages.  Shick M.D.   On: 12/24/2014 15:47   Ir Angiogram Visceral Selective  12/24/2014   CLINICAL DATA:  Acute lower GI bleeding by nuclear medicine exam involving the proximal descending colon.  EXAM: ULTRASOUND GUIDANCE FOR VASCULAR ACCESS  CELIAC, SMA, AND IMA CATHETERIZATIONS AND ANGIOGRAMS  Date:  8/27/20168/27/2016 2:58 pm  Radiologist:  M. Daryll Brod, MD  Guidance:  ULTRASOUND FLUOROSCOPIC  FLUOROSCOPY TIME:  9 MINUTES 36 SECONDS, 1,024 MGY  MEDICATIONS AND MEDICAL HISTORY: 1 mg Versed, 50 mcg fentanyl  ANESTHESIA/SEDATION: 30  minutes  CONTRAST:  149mL OMNIPAQUE IOHEXOL 300 MG/ML  SOLN  COMPLICATIONS: None immediate  PROCEDURE: Informed consent was obtained from the patient following explanation of the procedure, risks, benefits and alternatives. The patient understands, agrees and consents for the procedure. All questions were addressed. A time out was performed.  Maximal barrier sterile technique utilized including caps, mask, sterile gowns, sterile gloves, large sterile drape, hand hygiene, and ChloraPrep.  Under sterile conditions and local anesthesia, ultrasound micropuncture access performed of the right common femoral artery. Five French sheath inserted over a Bentson guidewire. C2 catheter advanced over guidewire and utilized initially to select the celiac artery. Selective celiac angiogram performed.  Celiac: Celiac origin is patent. Splenic, left gastric and hepatic vasculature are patent. Gastroduodenal artery patent. No active upper GI tract bleeding demonstrated.  Catheter was retracted and utilized to select the SMA origin. Selective SMA angiogram performed.  SMA: SMA origin is patent. Main SMA trunk is patent. Jejunal and colic branches appear patent. No evidence of active bleeding.  Catheter was retracted and exchanged for a Sos Omni select catheter. This catheter was utilized to select the IMA origin.  IMA: IMA main trunk is patent. The left colic and superior hemorrhoidal branches are all patent. No evidence of active bleeding.  Multiple attempts were made to access the left colic branches with a micro catheter and micro guidewire from the IMA origin. However the catheter and guidewire would not easily advance peripherally into the IMA. Repeat injection of the IMA demonstrates a small proximal IMA dissection. This is minimally flow limiting. No thrombus. Because of this, the procedure was stopped.  IMPRESSION: Successful celiac, SMA and IMA angiograms without evidence of active colonic lower GI bleeding.  Minor proximal  IMA dissection related to catheter and guidewire manipulation without occlusion.   Electronically Signed   By: Jerilynn Mages.  Shick M.D.   On: 12/24/2014 15:47   Ir Angiogram Visceral Selective  12/24/2014   CLINICAL DATA:  Acute lower GI bleeding by nuclear medicine exam involving the proximal descending colon.  EXAM: ULTRASOUND GUIDANCE FOR VASCULAR ACCESS  CELIAC, SMA, AND IMA CATHETERIZATIONS AND ANGIOGRAMS  Date:  8/27/20168/27/2016 2:58 pm  Radiologist:  M. Daryll Brod, MD  Guidance:  ULTRASOUND FLUOROSCOPIC  FLUOROSCOPY TIME:  9 MINUTES 36 SECONDS, 1,024 MGY  MEDICATIONS AND MEDICAL HISTORY: 1 mg Versed, 50 mcg fentanyl  ANESTHESIA/SEDATION: 30 minutes  CONTRAST:  155mL OMNIPAQUE IOHEXOL 300 MG/ML  SOLN  COMPLICATIONS: None immediate  PROCEDURE: Informed consent was obtained from the patient following explanation of the procedure, risks, benefits and alternatives. The patient understands, agrees and consents for the procedure. All questions were addressed. A time out was performed.  Maximal barrier sterile technique utilized including caps, mask, sterile gowns, sterile gloves, large sterile drape, hand hygiene, and ChloraPrep.  Under sterile conditions and local anesthesia, ultrasound micropuncture access performed of the right common femoral artery. Five French sheath inserted over a Bentson guidewire. C2 catheter advanced over guidewire and utilized initially to select the celiac artery. Selective celiac angiogram performed.  Celiac: Celiac origin is patent. Splenic, left gastric and hepatic vasculature are patent. Gastroduodenal artery patent. No active upper GI tract bleeding demonstrated.  Catheter was retracted and utilized to select the SMA origin. Selective SMA angiogram performed.  SMA: SMA origin is patent. Main SMA trunk is patent. Jejunal and colic branches appear patent. No evidence of active bleeding.  Catheter was retracted and exchanged for a Sos Omni select catheter. This catheter was utilized to  select the IMA origin.  IMA: IMA main trunk is patent. The left colic and superior hemorrhoidal branches are all patent. No evidence of active bleeding.  Multiple attempts were made to access the left colic branches with a micro catheter and micro guidewire from the IMA origin. However the catheter and guidewire would not easily advance peripherally into the IMA. Repeat injection of the IMA demonstrates a small proximal IMA dissection. This is minimally flow limiting. No thrombus. Because of this, the procedure was stopped.  IMPRESSION: Successful celiac, SMA and IMA angiograms without evidence of active colonic lower GI bleeding.  Minor proximal IMA dissection related to catheter and guidewire manipulation without occlusion.  Electronically Signed   By: Jerilynn Mages.  Shick M.D.   On: 12/24/2014 15:47   Ir US Guide Vasc Access Right  12/24/2014   CLINICAL DATA:  Acute lower GI bleeding by nuclear medicine exam involving the proximal descending colon.  EXAM: ULTRASOUND GUIDANCE FOR VASCULAR ACCESS  CELIAC, SMA, AND IMA CATHETERIZATIONS AND ANGIOGRAMS  Date:  8/27/20168/27/2016 2:58 pm  Radiologist:  M. Daryll Brod, MD  Guidance:  ULTRASOUND FLUOROSCOPIC  FLUOROSCOPY TIME:  9 MINUTES 36 SECONDS, 1,024 MGY  MEDICATIONS AND MEDICAL HISTORY: 1 mg Versed, 50 mcg fentanyl  ANESTHESIA/SEDATION: 30 minutes  CONTRAST:  127mL OMNIPAQUE IOHEXOL 300 MG/ML  SOLN  COMPLICATIONS: None immediate  PROCEDURE: Informed consent was obtained from the patient following explanation of the procedure, risks, benefits and alternatives. The patient understands, agrees and consents for the procedure. All questions were addressed. A time out was performed.  Maximal barrier sterile technique utilized including caps, mask, sterile gowns, sterile gloves, large sterile drape, hand hygiene, and ChloraPrep.  Under sterile conditions and local anesthesia, ultrasound micropuncture access performed of the right common femoral artery. Five French sheath  inserted over a Bentson guidewire. C2 catheter advanced over guidewire and utilized initially to select the celiac artery. Selective celiac angiogram performed.  Celiac: Celiac origin is patent. Splenic, left gastric and hepatic vasculature are patent. Gastroduodenal artery patent. No active upper GI tract bleeding demonstrated.  Catheter was retracted and utilized to select the SMA origin. Selective SMA angiogram performed.  SMA: SMA origin is patent. Main SMA trunk is patent. Jejunal and colic branches appear patent. No evidence of active bleeding.  Catheter was retracted and exchanged for a Sos Omni select catheter. This catheter was utilized to select the IMA origin.  IMA: IMA main trunk is patent. The left colic and superior hemorrhoidal branches are all patent. No evidence of active bleeding.  Multiple attempts were made to access the left colic branches with a micro catheter and micro guidewire from the IMA origin. However the catheter and guidewire would not easily advance peripherally into the IMA. Repeat injection of the IMA demonstrates a small proximal IMA dissection. This is minimally flow limiting. No thrombus. Because of this, the procedure was stopped.  IMPRESSION: Successful celiac, SMA and IMA angiograms without evidence of active colonic lower GI bleeding.  Minor proximal IMA dissection related to catheter and guidewire manipulation without occlusion.   Electronically Signed   By: Jerilynn Mages.  Shick M.D.   On: 12/24/2014 15:47   Dg Chest Port 1 View  12/24/2014   CLINICAL DATA:  Doctor attempted central line placement on the left side. Ended up putting it in the femoral.  EXAM: PORTABLE CHEST - 1 VIEW  COMPARISON:  06/22/2011  FINDINGS: No pneumothorax following attempted central line placement.  Lungs are clear.  Heart, mediastinum and hila are unremarkable.  Old rib fractures on the left.  IMPRESSION: No acute cardiopulmonary disease.  No pneumothorax.   Electronically Signed   By: Lajean Manes M.D.    On: 12/24/2014 12:46   ROS: Constitutional: no chronic weight loss or abdominal pain HEENT: negative Cardiovascular: no history of heart disease chest pain Respiratory: shortness of breath or difficulty breathing GI: as above GU: does have problems urinating laying down and often has to stand up to urinate may need catheterization Musculoskeletal: minimal chronic arthritic complaints does not use NSAIDs Neuro/Psychiatric: negative Endocrine/Heme: negative            Blood pressure 113/43, pulse 82, temperature 97.6 F (36.4 C), temperature  source Oral, resp. rate 18, height 5\' 11"  (1.803 m), weight 91.5 kg (201 lb 11.5 oz), SpO2 99 %.  Physical exam:   General-- pleasant white male playing in hospital bed ENT-- nonicteric Neck-- supple Heart-- regular rate and rhythm without murmurs or gallops Lungs-- clear Abdomen-- soft and completely nontender Psych-- alert and oriented, appropriate   Assessment: 1. Acute lower G.I. bleed. Almost certainly diverticular.  Plan: hopefully he has stopped bleeding with negative angiogram. We will go ahead and add Miralax in the hopes of cleaning out his stool and hope that he will not start bleeding again. If so he may need repeat angiogram or surgery. Have discussed this in detail with the patient and his wife.   Shawn Lowery,Janelly Switalski L 12/24/2014, 4:24 PM   Pager: [phone] If no answer or after hours call [phone]

## 2014-12-24 NOTE — Progress Notes (Signed)
Pt has had 3 large bright red bloody stools this shift. Pt states he feels light headed and dizzy at times. Bed alarm placed for safety. Cont to monitor.

## 2014-12-24 NOTE — Procedures (Signed)
PROCEDURE NOTE: L FEMORAL CVL PLACEMENT  INDICATION:    Monitoring of central venous pressures and/or administration of medications optimally administered in central vein  CONSENT:   Risks of procedure as well as the alternatives were explained to the patient or surrogate. Consent for procedure obtained. A time out was performed.   PROCEDURE  Sterile technique was used including antiseptics, cap, gloves, gown, hand hygiene, mask and full body sheet.  Skin prep: Chlorhexidine; local anesthetic administered  After several unsuccessful attempts at a L [place] approach, a triple lumen catheter was placed in the L femoral vein using the Seldinger technique.  Ultrasound was used for vessel identification and guidance.   EVALUATION:  Blood flow good  Complications: No apparent complications  Patient tolerated the procedure well.  Chest X-ray revealed no PTX fter L [place] CVL attempt   Merton Border, MD PCCM service [place] [phone]

## 2014-12-24 NOTE — Progress Notes (Signed)
Patient sent with [facility]. First unit of blood finished and this RN checked blood with [facility] RN but [facility] to start blood. Wife and other family took belongings of patient and updated about patient leaving for [facility].

## 2014-12-24 NOTE — Progress Notes (Signed)
[facility]Link Physician-Brief Progress Note Patient Name: Shawn Lowery DOB: [birth year] MRN: [id number]   Date of Service  12/24/2014  HPI/Events of Note  Unable to void.  eICU Interventions  I/O Cath X 1 now.      Intervention Category Minor Interventions: Routine modifications to care plan (e.g. PRN medications for pain, fever)  Alyn Jurney Eugene 12/24/2014, 6:26 PM

## 2014-12-24 NOTE — Progress Notes (Signed)
[facility] NAME: Shawn Lowery    MR#:  [id number]  DATE OF BIRTH:  [birth year]  SUBJECTIVE:  CHIEF COMPLAINT:   Chief Complaint  Patient presents with  . Rectal Bleeding   Patient admitted for active lower GI bleed. Multiple bloody stools all night long. This am, passed out in the bathroom. BP 90/54, dropped from 130'/80. HR initially elevated to 170's and improved to 70. Stat H&H ordered. IV fluid bolus. GI bleeding scan ordered and transferring patient to ICU. Patient is alert and oriented, feels weak, cold and clammy.  REVIEW OF SYSTEMS:  Review of Systems  Constitutional: Negative for fever and chills.  Respiratory: Negative for cough, shortness of breath and wheezing.   Cardiovascular: Negative for chest pain and palpitations.  Gastrointestinal: Positive for blood in stool. Negative for nausea, vomiting, abdominal pain, diarrhea and constipation.  Genitourinary: Negative for dysuria, urgency and frequency.  Musculoskeletal: Negative for myalgias, back pain, joint pain and neck pain.  Neurological: Positive for dizziness and weakness. Negative for sensory change, speech change, focal weakness, seizures and headaches.  Psychiatric/Behavioral: Negative for depression.    DRUG ALLERGIES:  No Known Allergies  VITALS:  Blood pressure 88/53, pulse 71, temperature 97.4 F (36.3 C), temperature source Oral, resp. rate 18, height 5\' 11"  (1.803 m), weight 88.27 kg (194 lb 9.6 oz), SpO2 99 %.  PHYSICAL EXAMINATION:  Physical Exam  GENERAL:  69 y.o.-year-old patient sitting on the toilet and feels dizzy, has blood all over his back and lower abdomen after the fall.Marland Kitchen  EYES: Pupils equal, round, reactive to light and accommodation. No scleral icterus. Extraocular muscles intact.  HEENT: Head atraumatic, normocephalic. Oropharynx and nasopharynx clear.  NECK:  Supple, no jugular venous distention. No thyroid enlargement, no  tenderness.  LUNGS: Normal breath sounds bilaterally, no wheezing, rales,rhonchi or crepitation. No use of accessory muscles of respiration.  CARDIOVASCULAR: S1, S2 normal. No murmurs, rubs, or gallops.  ABDOMEN: Soft, nontender, nondistended. Bowel sounds present. No organomegaly or mass.  EXTREMITIES: No pedal edema, cyanosis, or clubbing.  NEUROLOGIC: Cranial nerves II through XII are intact. Muscle strength 5/5 in all extremities. Sensation intact. Gait not checked.  PSYCHIATRIC: The patient is alert and oriented x 3. Answering questions appropriately. Doesn't remember the fall, no loss of consciousness. SKIN: No obvious rash, lesion, or ulcer.    LABORATORY PANEL:   CBC  Recent Labs Lab 12/24/14 0253  WBC 7.6  HGB 12.5*  HCT 37.5*  PLT 186   ------------------------------------------------------------------------------------------------------------------  Chemistries   Recent Labs Lab 12/23/14 1658 12/24/14 0253  NA 141 140  K 3.9 3.7  CL 105 105  CO2 30 29  GLUCOSE 117* 112*  BUN 19 17  CREATININE 0.85 0.67  CALCIUM 9.4 8.7*  AST 35  --   ALT 38  --   ALKPHOS 47  --   BILITOT 0.7  --    ------------------------------------------------------------------------------------------------------------------  Cardiac Enzymes No results for input(s): TROPONINI in the last 168 hours. ------------------------------------------------------------------------------------------------------------------  RADIOLOGY:  No results found.  EKG:   Orders placed or performed in visit on 10/20/07  . EKG 12-Lead    ASSESSMENT AND PLAN:   69 year old male with past medical history of hypertension, hyperlipidemia, history of diverticulosis, asthma,who presents to the hospital due to multiple episodes of rectal bleeding.  #1 GI bleed-likely a lower GI bleed - but ongoing bleeding- stat GI bleeding scan, transfer to IC - GI consult, change his diet to  NPO - stat hemoglobin,  type and cross match - 1 liter fluid bolus. - Hold aspirin. Transfuse if needed.  #2 Presyncopal episode- since the bleeding started- twice - likely vasovagal, also BP dropping right after the episode, improving with fluids so far - H&H - Bedrest only for now  #3 hypertension-hold antihypertensives given the patient's GI bleed.  #4 hyperlipidemia-continue Pravachol  #5 asthma-continue Symbicort. Changed to dulera per hospital policy.  #6 DVT prophylaxis- TEDs and SCDs  All the records are reviewed and case discussed with Care Management/Social Workerr. Management plans discussed with the patient, family and they are in agreement.  CODE STATUS: FULL CODE  TOTAL CRITICAL CARE TIME SPENT IN TAKING CARE OF THIS PATIENT: 45 minutes.   POSSIBLE D/C IN 2-3 DAYS, DEPENDING ON CLINICAL CONDITION.   Gladstone Lighter M.D on 12/24/2014 at 8:14 AM  Between 7am to 6pm - Pager - [phone]  After 6pm go to [email or web address] - password [email or web address]  Office  [phone]  CC: Primary care physician; Sallee Lange, NP

## 2014-12-24 NOTE — Progress Notes (Signed)
Spoke with MD in person. Patient had another large red bloody stool. Patient passed out briefly (only a few seconds) on bedpan while having bowel movement. Blood pressure low post bowel movement 80/47 MAP of 58. MD ordered 500 mL bolus of NS and put in orders to transfuse blood when patient returns from bleeding scan. Wife at bedside and updated on plan of care by RN and MD

## 2014-12-24 NOTE — H&P (Signed)
PULMONARY / CRITICAL CARE MEDICINE   Name: Shawn Lowery MRN: [id number] DOB: Aug 30, 1945    ADMISSION DATE:  12/24/2014   REFERRING MD :  [facility]  CHIEF COMPLAINT: Bloody stools  INITIAL PRESENTATION: Bloodystools  STUDIES:  [facility] GI  SIGNIFICANT EVENTS: 8/27 tx to [facility]   HISTORY OF PRESENT ILLNESS:  69 yo former smoker with PMH of HTN, diverticulosis, diverticulitis who presented to [facility] 8/26 /16 with new 6-7 bloody stools. He has NUC MED GI study which revealed(active GI bleeding at the proximal descending colon). [place] unable to perform coiling of offending vessel and he is being transferred to [facility] for IR coiling. PCCM will be the admitting doctor and he can be transferred to [facility] once stabilized. Note he had near syncopal episode prior to admit. PAST MEDICAL HISTORY :   has a past medical history of Hypertension; Hyperlipemia; Diverticulosis; and Diverticulitis.  has no past surgical history on file. Prior to Admission medications   Medication Sig Start Date End Date Taking? Authorizing Provider  Ascorbic Acid (VITAMIN C) 1000 MG tablet Take 1,000 mg by mouth daily.    Historical Provider, MD  aspirin EC 81 MG tablet Take 81 mg by mouth daily.    Historical Provider, MD  Fluticasone-Salmeterol (ADVAIR) 100-50 MCG/DOSE AEPB Inhale 1 puff into the lungs 2 (two) times daily.    Historical Provider, MD  lisinopril-hydrochlorothiazide (PRINZIDE,ZESTORETIC) 20-25 MG per tablet Take 1 tablet by mouth daily.    Historical Provider, MD  lovastatin (MEVACOR) 20 MG tablet Take 20 mg by mouth daily.     Historical Provider, MD  Multiple Vitamin (MULTIVITAMIN WITH MINERALS) TABS tablet Take 1 tablet by mouth every Monday, Wednesday, and Friday.    Historical Provider, MD   No Known Allergies  FAMILY HISTORY:  indicated that his mother is deceased. He indicated that his father is deceased.  SOCIAL HISTORY:  reports that he has quit smoking. He has never used smokeless tobacco. He reports  that he does not drink alcohol or use illicit drugs.  REVIEW OF SYSTEMS: 10 point review of system taken, please see HPI for positives and negatives.   SUBJECTIVE: nad  VITAL SIGNS: Temp:  [97.2 F (36.2 C)-98.4 F (36.9 C)] 97.6 F (36.4 C) (08/27 1240) Pulse Rate:  [42-113] 85 (08/27 1430) Resp:  [9-24] 12 (08/27 1430) BP: (68-156)/(37-94) 104/50 mmHg (08/27 1430) SpO2:  [92 %-100 %] 100 % (08/27 1430) Weight:  [194 lb 9.6 oz (88.27 kg)-200 lb (90.719 kg)] 194 lb 9.6 oz (88.27 kg) (08/26 2058) HEMODYNAMICS:   VENTILATOR SETTINGS:   INTAKE / OUTPUT: No intake or output data in the 24 hours ending 12/24/14 1433  PHYSICAL EXAMINATION: General:  WNWDWF NAD Neuro:  Intact HEENT:  No JVD/LAN Cardiovascular:  HSR RRR Lungs:  CTA Abdomen:  Tender Musculoskeletal:  Intact Skin:  Warm and dry  LABS:  CBC  Recent Labs Lab 12/23/14 1658 12/23/14 2122 12/24/14 0253 12/24/14 0822  WBC 12.0*  --  7.6  --   HGB 13.4 13.1 12.5* 10.1*  HCT 40.6  --  37.5*  --   PLT 209  --  186  --    Coag's  Recent Labs Lab 12/24/14 0836  APTT 24  INR 1.16   BMET  Recent Labs Lab 12/23/14 1658 12/24/14 0253  NA 141 140  K 3.9 3.7  CL 105 105  CO2 30 29  BUN 19 17  CREATININE 0.85 0.67  GLUCOSE 117* 112*   Electrolytes  Recent Labs  Lab 12/23/14 1658 12/24/14 0253  CALCIUM 9.4 8.7*   Sepsis Markers No results for input(s): LATICACIDVEN, PROCALCITON, O2SATVEN in the last 168 hours. ABG No results for input(s): PHART, PCO2ART, PO2ART in the last 168 hours. Liver Enzymes  Recent Labs Lab 12/23/14 1658  AST 35  ALT 38  ALKPHOS 47  BILITOT 0.7  ALBUMIN 3.7   Cardiac Enzymes No results for input(s): TROPONINI, PROBNP in the last 168 hours. Glucose No results for input(s): GLUCAP in the last 168 hours.  Imaging Ct Head Wo Contrast  12/24/2014   CLINICAL DATA:  Admitted for acute lower GI bleed, now with syncopal episode.  EXAM: CT HEAD WITHOUT CONTRAST   TECHNIQUE: Contiguous axial images were obtained from the base of the skull through the vertex without intravenous contrast.  COMPARISON:  None.  FINDINGS: Regional soft tissues appear normal. No radiopaque foreign body. No definite displaced calvarial fracture.  There is mild atrophy with mild diffuse sulcal prominence. Gray-white differentiation is maintained. No CT evidence of acute large territory infarct. No intraparenchymal or extra-axial mass or hemorrhage. Normal size and configuration of the ventricles and basilar cisterns. No midline shift.  There is mild mucosal thickening involving the bilateral frontal sinuses as well as the anterior and posterior ethmoidal air cells. Polypoid mucosal thickening of the left maxillary sinus. No air-fluid levels. Mastoid air cells are normally aerated.  IMPRESSION: 1. Mild atrophy without acute intracranial process. 2. Sinus disease as above.  No air-fluid levels.   Electronically Signed   By: Sandi Mariscal M.D.   On: 12/24/2014 09:40   Nm Gi Blood Loss  12/24/2014   CLINICAL DATA:  Active GI bleeding for 2 days, history diverticulitis, syncopal episodes, hypotensive, history hypertension  EXAM: NUCLEAR MEDICINE GASTROINTESTINAL BLEEDING SCAN  TECHNIQUE: Sequential abdominal images were obtained following intravenous administration of Tc-64m labeled red blood cells.  RADIOPHARMACEUTICALS:  23.3 mCi Tc-46m in-vitro labeled autologous red cells.  COMPARISON:  None; correlation made with CT abdomen 08/15/2009  FINDINGS: Normal initial blood pool distribution of tracer.  Within the first 10 minutes, abnormal GI tracer localization is seen in the lateral LEFT abdomen.  This tracer descends along the expected course of the descending colon into sigmoid colon.  This most likely represents a site of active GI bleeding at the proximal descending colon.  Later images demonstrate localization of a moderate amount of tracer within a loop in the lateral LEFT abdomen, suspect  retrograde tracer passage into a redundant distal transverse colon.  IMPRESSION: Positive GI bleeding exam for a site of active GI bleeding at the proximal descending colon.  Findings called to [place] in ICU on 12/24/2014 at 1120 hours.   Electronically Signed   By: Lavonia Dana M.D.   On: 12/24/2014 11:21   Dg Chest Port 1 View  12/24/2014   CLINICAL DATA:  Doctor attempted central line placement on the left side. Ended up putting it in the femoral.  EXAM: PORTABLE CHEST - 1 VIEW  COMPARISON:  06/22/2011  FINDINGS: No pneumothorax following attempted central line placement.  Lungs are clear.  Heart, mediastinum and hila are unremarkable.  Old rib fractures on the left.  IMPRESSION: No acute cardiopulmonary disease.  No pneumothorax.   Electronically Signed   By: Lajean Manes M.D.   On: 12/24/2014 12:46    ASSESSMENT / PLAN:  PULMONARY OETT A: Currently no acute issue P:  O2 as needed  CARDIOVASCULAR CVL A:  Hypotensive  P:  Hold antihypertensives Fluid resuscitation Maintain  hgb >8.0 Pressors as needed Place cvl  RENAL A:  No acute issue P:    GASTROINTESTINAL A:  GIB P:  PPI IR coiling of vessel  HEMATOLOGIC  Recent Labs  12/24/14 0253 12/24/14 0822  HGB 12.5* 10.1*           A:  Monitor for acute blood loss anemia  P:  Serial cbc  INFECTIOUS A:  No acute process P:    ENDOCRINE A:  No acute issue P:    NEUROLOGIC A:  Neuro intact P:  RASS goal: 1    FAMILY  - Updates:   - Inter-disciplinary family meet or Palliative Care meeting due by: day 7       TODAY'S SUMMARY:  69 yo former smoker with PMH of HTN, diverticulosis, diverticulitis who presented to [facility] 8/26 /16 with new 6-7 bloody stools. He has NUC MED GI study which revealed(active GI bleeding at the proximal descending colon). [place] unable to perform coiling of offending vessel and he is being transferred to [facility] for IR coiling. PCCM will be the  admitting doctor and he can be transferred to [facility] once stabilized. Note he had near syncopal episode prior to admit.  Richardson Landry Juelz Whittenberg ACNP Maryanna Shape PCCM Pager [phone] till 3 pm If no answer page [phone] 12/24/2014, 2:33 PM

## 2014-12-24 NOTE — Progress Notes (Signed)
Report called to [place] on [facility], patient to be transferred when central line finished being placed. [facility] to take second unit of blood with them to hand in route to [facility].

## 2014-12-24 NOTE — Progress Notes (Addendum)
Patient went for bleeding scan. Discussed with vascular about possible embolization if the scan is positive as patient is having active bleeding at this time. -Vascular unable to do embolization here, discussed with interventional radiology as well and they're not able to do embolization here. [facility] called and awaiting for the patient to be transferred to [place] notified this AM.  - Patient had another active bowel movement before going to bleeding scan. Blood pressure dropped, giving 500 cc fluid bolus at this time. -Hemoglobin dropped to 10 this morning. We'll go ahead and transfuse 1 unit packed RBC as he is hemodynamically unstable. -Coag labs are ordered.  Additional critical care time spent: 40 min

## 2014-12-24 NOTE — Progress Notes (Signed)
Patient refuses to remain in bed for designated time of 4 hours post op IVR. States that he has to use bathroom and will not stay in bed at this time. Unable to urinate on side of bed, patient demanding to get out of bed to use bathroom or bedside commode. MD notified, one time order obtained to obtain in and out urine catheterization.

## 2014-12-24 NOTE — Procedures (Signed)
Successful 3V mesenteric angio Neg for active bleeding Minor IMA origin dissection from microguidwire Stable Full report in PACS

## 2014-12-25 DIAGNOSIS — J328 Other chronic sinusitis: Secondary | ICD-10-CM

## 2014-12-25 DIAGNOSIS — J329 Chronic sinusitis, unspecified: Secondary | ICD-10-CM | POA: Insufficient documentation

## 2014-12-25 LAB — TYPE AND SCREEN
ABO/RH(D): O POS
Antibody Screen: NEGATIVE
UNIT DIVISION: 0
UNIT DIVISION: 0

## 2014-12-25 LAB — CBC
HCT: 23.7 % — ABNORMAL LOW (ref 39.0–52.0)
HCT: 23.4 % — ABNORMAL LOW (ref 39.0–52.0)
HEMATOCRIT: 24.6 % — AB (ref 39.0–52.0)
HEMATOCRIT: 20.7 % — AB (ref 39.0–52.0)
HEMOGLOBIN: 8.5 g/dL — AB (ref 13.0–17.0)
HEMOGLOBIN: 8.1 g/dL — AB (ref 13.0–17.0)
Hemoglobin: 8 g/dL — ABNORMAL LOW (ref 13.0–17.0)
Hemoglobin: 7.1 g/dL — ABNORMAL LOW (ref 13.0–17.0)
MCH: 30.8 pg (ref 26.0–34.0)
MCH: 30.3 pg (ref 26.0–34.0)
MCH: 30.2 pg (ref 26.0–34.0)
MCH: 30.2 pg (ref 26.0–34.0)
MCHC: 34.6 g/dL (ref 30.0–36.0)
MCHC: 34.2 g/dL (ref 30.0–36.0)
MCHC: 34.2 g/dL (ref 30.0–36.0)
MCHC: 34.3 g/dL (ref 30.0–36.0)
MCV: 89.1 fL (ref 78.0–100.0)
MCV: 88.8 fL (ref 78.0–100.0)
MCV: 88.3 fL (ref 78.0–100.0)
MCV: 88.1 fL (ref 78.0–100.0)
PLATELETS: 136 10*3/uL — AB (ref 150–400)
PLATELETS: 155 10*3/uL (ref 150–400)
PLATELETS: 142 10*3/uL — AB (ref 150–400)
Platelets: 147 10*3/uL — ABNORMAL LOW (ref 150–400)
RBC: 2.76 MIL/uL — AB (ref 4.22–5.81)
RBC: 2.67 MIL/uL — ABNORMAL LOW (ref 4.22–5.81)
RBC: 2.65 MIL/uL — ABNORMAL LOW (ref 4.22–5.81)
RBC: 2.35 MIL/uL — ABNORMAL LOW (ref 4.22–5.81)
RDW: 15.5 % (ref 11.5–15.5)
RDW: 15.6 % — AB (ref 11.5–15.5)
RDW: 15.4 % (ref 11.5–15.5)
RDW: 15 % (ref 11.5–15.5)
WBC: 10 10*3/uL (ref 4.0–10.5)
WBC: 9.2 10*3/uL (ref 4.0–10.5)
WBC: 9.1 10*3/uL (ref 4.0–10.5)
WBC: 8.4 10*3/uL (ref 4.0–10.5)

## 2014-12-25 LAB — BASIC METABOLIC PANEL
Anion gap: 4 — ABNORMAL LOW (ref 5–15)
BUN: 15 mg/dL (ref 6–20)
CALCIUM: 7.3 mg/dL — AB (ref 8.9–10.3)
CHLORIDE: 108 mmol/L (ref 101–111)
CO2: 27 mmol/L (ref 22–32)
CREATININE: 0.85 mg/dL (ref 0.61–1.24)
GFR calc non Af Amer: >60 mL/min (ref >60)
Glucose, Bld: 123 mg/dL — ABNORMAL HIGH (ref 65–99)
Potassium: 3.6 mmol/L (ref 3.5–5.1)
SODIUM: 139 mmol/L (ref 135–145)

## 2014-12-25 LAB — TROPONIN I

## 2014-12-25 MED ORDER — ADULT MULTIVITAMIN W/MINERALS CH
1.0000 | ORAL_TABLET | ORAL | Status: DC
  Administered 2014-12-26: 1 via ORAL
  Filled 2014-12-25 (×2): qty 1

## 2014-12-25 MED ORDER — MAGNESIUM SULFATE 2 GM/50ML IV SOLN
2.0000 g | Freq: Once | INTRAVENOUS | Status: AC
  Administered 2014-12-25: 2 g via INTRAVENOUS
  Filled 2014-12-25: qty 50

## 2014-12-25 MED ORDER — SALINE SPRAY 0.65 % NA SOLN: 1.0000 | NASAL | Status: DC | PRN

## 2014-12-25 MED ORDER — MOMETASONE FURO-FORMOTEROL FUM 100-5 MCG/ACT IN AERO
2.0000 | INHALATION_SPRAY | Freq: Two times a day (BID) | RESPIRATORY_TRACT | Status: DC
  Administered 2014-12-25 – 2014-12-26 (×2): 2 via RESPIRATORY_TRACT
  Filled 2014-12-25: qty 8.8

## 2014-12-25 MED ORDER — FLUTICASONE PROPIONATE 50 MCG/ACT NA SUSP
2.0000 | Freq: Every day | NASAL | Status: DC
  Administered 2014-12-25: 2 via NASAL
  Filled 2014-12-25: qty 16

## 2014-12-25 MED ORDER — VITAMIN C 500 MG PO TABS
1000.0000 mg | ORAL_TABLET | Freq: Every day | ORAL | Status: DC
  Administered 2014-12-25 – 2014-12-27 (×3): 1000 mg via ORAL
  Filled 2014-12-25 (×3): qty 2

## 2014-12-25 MED ORDER — PANTOPRAZOLE SODIUM 40 MG PO TBEC
40.0000 mg | DELAYED_RELEASE_TABLET | Freq: Every day | ORAL | Status: DC
  Administered 2014-12-25 – 2014-12-27 (×3): 40 mg via ORAL
  Filled 2014-12-25 (×3): qty 1

## 2014-12-25 MED ORDER — PRAVASTATIN SODIUM 20 MG PO TABS
10.0000 mg | ORAL_TABLET | Freq: Every day | ORAL | Status: DC
  Administered 2014-12-25 – 2014-12-26 (×2): 10 mg via ORAL
  Filled 2014-12-25 (×3): qty 1

## 2014-12-25 NOTE — Progress Notes (Signed)
Patient ID: Shawn Lowery, male   DOB: [birth year], 69 y.o.   MRN: [id number]     Referring Physician(s): Ramaswamy,Murali  Chief Complaint:  GI Bleed S/P Mesenteric Arteriogram without evidence of active colonic bleeding  Subjective:  Shawn Lowery is doing well this morning.  He is sitting up in the chair and states he feels pretty good.  He denies any abdominal pain, N/V.  He states "there is only old blood when I go to the bathroom"  Allergies: Review of patient's allergies indicates no known allergies.  Medications: Prior to Admission medications   Medication Sig Start Date End Date Taking? Authorizing Provider  Ascorbic Acid (VITAMIN C) 1000 MG tablet Take 1,000 mg by mouth daily.   Yes Historical Provider, MD  Fluticasone-Salmeterol (ADVAIR) 100-50 MCG/DOSE AEPB Inhale 1 puff into the lungs 2 (two) times daily as needed (for wheezing or shortness of breath).    Yes Historical Provider, MD  lisinopril-hydrochlorothiazide (PRINZIDE,ZESTORETIC) 20-25 MG per tablet Take 1 tablet by mouth daily.   Yes Historical Provider, MD  lovastatin (MEVACOR) 20 MG tablet Take 20 mg by mouth daily.    Yes Historical Provider, MD  Multiple Vitamin (MULTIVITAMIN WITH MINERALS) TABS tablet Take 1 tablet by mouth every Monday, Wednesday, and Friday.   Yes Historical Provider, MD  naproxen (NAPROSYN) 500 MG tablet Take 500 mg by mouth daily.   Yes Historical Provider, MD  sodium chloride (OCEAN) 0.65 % SOLN nasal spray Place 1 spray into both nostrils as needed for congestion.   Yes Historical Provider, MD     Vital Signs: BP 125/49 mmHg  Pulse 85  Temp(Src) 97.6 F (36.4 C) (Oral)  Resp 10  Ht 5\' 11"  (1.803 m)  Wt 201 lb 11.5 oz (91.5 kg)  BMI 28.15 kg/m2  SpO2 99%  Physical Exam  Awake and Alert, up in Chair Abdomen Soft, NT Heart RRR Lungs Clear Right groin stick site ok, no hematoma or pseudoaneurysm Left groin with central line in place  Imaging: Ct Head Wo Contrast  12/24/2014    CLINICAL DATA:  Admitted for acute lower GI bleed, now with syncopal episode.  EXAM: CT HEAD WITHOUT CONTRAST  TECHNIQUE: Contiguous axial images were obtained from the base of the skull through the vertex without intravenous contrast.  COMPARISON:  None.  FINDINGS: Regional soft tissues appear normal. No radiopaque foreign body. No definite displaced calvarial fracture.  There is mild atrophy with mild diffuse sulcal prominence. Gray-white differentiation is maintained. No CT evidence of acute large territory infarct. No intraparenchymal or extra-axial mass or hemorrhage. Normal size and configuration of the ventricles and basilar cisterns. No midline shift.  There is mild mucosal thickening involving the bilateral frontal sinuses as well as the anterior and posterior ethmoidal air cells. Polypoid mucosal thickening of the left maxillary sinus. No air-fluid levels. Mastoid air cells are normally aerated.  IMPRESSION: 1. Mild atrophy without acute intracranial process. 2. Sinus disease as above.  No air-fluid levels.   Electronically Signed   By: Sandi Mariscal M.D.   On: 12/24/2014 09:40   Nm Gi Blood Loss  12/24/2014   CLINICAL DATA:  Active GI bleeding for 2 days, history diverticulitis, syncopal episodes, hypotensive, history hypertension  EXAM: NUCLEAR MEDICINE GASTROINTESTINAL BLEEDING SCAN  TECHNIQUE: Sequential abdominal images were obtained following intravenous administration of Tc-54m labeled red blood cells.  RADIOPHARMACEUTICALS:  23.3 mCi Tc-65m in-vitro labeled autologous red cells.  COMPARISON:  None; correlation made with CT abdomen 08/15/2009  FINDINGS: Normal initial blood  pool distribution of tracer.  Within the first 10 minutes, abnormal GI tracer localization is seen in the lateral LEFT abdomen.  This tracer descends along the expected course of the descending colon into sigmoid colon.  This most likely represents a site of active GI bleeding at the proximal descending colon.  Later images  demonstrate localization of a moderate amount of tracer within a loop in the lateral LEFT abdomen, suspect retrograde tracer passage into a redundant distal transverse colon.  IMPRESSION: Positive GI bleeding exam for a site of active GI bleeding at the proximal descending colon.  Findings called to [place] in ICU on 12/24/2014 at 1120 hours.   Electronically Signed   By: Lavonia Dana M.D.   On: 12/24/2014 11:21   Ir Angiogram Visceral Selective  12/24/2014   CLINICAL DATA:  Acute lower GI bleeding by nuclear medicine exam involving the proximal descending colon.  EXAM: ULTRASOUND GUIDANCE FOR VASCULAR ACCESS  CELIAC, SMA, AND IMA CATHETERIZATIONS AND ANGIOGRAMS  Date:  8/27/20168/27/2016 2:58 pm  Radiologist:  M. Daryll Brod, MD  Guidance:  ULTRASOUND FLUOROSCOPIC  FLUOROSCOPY TIME:  9 MINUTES 36 SECONDS, 1,024 MGY  MEDICATIONS AND MEDICAL HISTORY: 1 mg Versed, 50 mcg fentanyl  ANESTHESIA/SEDATION: 30 minutes  CONTRAST:  12mL OMNIPAQUE IOHEXOL 300 MG/ML  SOLN  COMPLICATIONS: None immediate  PROCEDURE: Informed consent was obtained from the patient following explanation of the procedure, risks, benefits and alternatives. The patient understands, agrees and consents for the procedure. All questions were addressed. A time out was performed.  Maximal barrier sterile technique utilized including caps, mask, sterile gowns, sterile gloves, large sterile drape, hand hygiene, and ChloraPrep.  Under sterile conditions and local anesthesia, ultrasound micropuncture access performed of the right common femoral artery. Five French sheath inserted over a Bentson guidewire. C2 catheter advanced over guidewire and utilized initially to select the celiac artery. Selective celiac angiogram performed.  Celiac: Celiac origin is patent. Splenic, left gastric and hepatic vasculature are patent. Gastroduodenal artery patent. No active upper GI tract bleeding demonstrated.  Catheter was retracted and utilized to select the SMA  origin. Selective SMA angiogram performed.  SMA: SMA origin is patent. Main SMA trunk is patent. Jejunal and colic branches appear patent. No evidence of active bleeding.  Catheter was retracted and exchanged for a Sos Omni select catheter. This catheter was utilized to select the IMA origin.  IMA: IMA main trunk is patent. The left colic and superior hemorrhoidal branches are all patent. No evidence of active bleeding.  Multiple attempts were made to access the left colic branches with a micro catheter and micro guidewire from the IMA origin. However the catheter and guidewire would not easily advance peripherally into the IMA. Repeat injection of the IMA demonstrates a small proximal IMA dissection. This is minimally flow limiting. No thrombus. Because of this, the procedure was stopped.  IMPRESSION: Successful celiac, SMA and IMA angiograms without evidence of active colonic lower GI bleeding.  Minor proximal IMA dissection related to catheter and guidewire manipulation without occlusion.   Electronically Signed   By: Jerilynn Mages.  Shick M.D.   On: 12/24/2014 15:47   Ir Angiogram Visceral Selective  12/24/2014   CLINICAL DATA:  Acute lower GI bleeding by nuclear medicine exam involving the proximal descending colon.  EXAM: ULTRASOUND GUIDANCE FOR VASCULAR ACCESS  CELIAC, SMA, AND IMA CATHETERIZATIONS AND ANGIOGRAMS  Date:  8/27/20168/27/2016 2:58 pm  Radiologist:  M. Daryll Brod, MD  Guidance:  ULTRASOUND FLUOROSCOPIC  FLUOROSCOPY TIME:  9 MINUTES  36 SECONDS, 1,024 MGY  MEDICATIONS AND MEDICAL HISTORY: 1 mg Versed, 50 mcg fentanyl  ANESTHESIA/SEDATION: 30 minutes  CONTRAST:  144mL OMNIPAQUE IOHEXOL 300 MG/ML  SOLN  COMPLICATIONS: None immediate  PROCEDURE: Informed consent was obtained from the patient following explanation of the procedure, risks, benefits and alternatives. The patient understands, agrees and consents for the procedure. All questions were addressed. A time out was performed.  Maximal barrier sterile  technique utilized including caps, mask, sterile gowns, sterile gloves, large sterile drape, hand hygiene, and ChloraPrep.  Under sterile conditions and local anesthesia, ultrasound micropuncture access performed of the right common femoral artery. Five French sheath inserted over a Bentson guidewire. C2 catheter advanced over guidewire and utilized initially to select the celiac artery. Selective celiac angiogram performed.  Celiac: Celiac origin is patent. Splenic, left gastric and hepatic vasculature are patent. Gastroduodenal artery patent. No active upper GI tract bleeding demonstrated.  Catheter was retracted and utilized to select the SMA origin. Selective SMA angiogram performed.  SMA: SMA origin is patent. Main SMA trunk is patent. Jejunal and colic branches appear patent. No evidence of active bleeding.  Catheter was retracted and exchanged for a Sos Omni select catheter. This catheter was utilized to select the IMA origin.  IMA: IMA main trunk is patent. The left colic and superior hemorrhoidal branches are all patent. No evidence of active bleeding.  Multiple attempts were made to access the left colic branches with a micro catheter and micro guidewire from the IMA origin. However the catheter and guidewire would not easily advance peripherally into the IMA. Repeat injection of the IMA demonstrates a small proximal IMA dissection. This is minimally flow limiting. No thrombus. Because of this, the procedure was stopped.  IMPRESSION: Successful celiac, SMA and IMA angiograms without evidence of active colonic lower GI bleeding.  Minor proximal IMA dissection related to catheter and guidewire manipulation without occlusion.   Electronically Signed   By: Jerilynn Mages.  Shick M.D.   On: 12/24/2014 15:47   Ir Angiogram Visceral Selective  12/24/2014   CLINICAL DATA:  Acute lower GI bleeding by nuclear medicine exam involving the proximal descending colon.  EXAM: ULTRASOUND GUIDANCE FOR VASCULAR ACCESS  CELIAC, SMA,  AND IMA CATHETERIZATIONS AND ANGIOGRAMS  Date:  8/27/20168/27/2016 2:58 pm  Radiologist:  M. Daryll Brod, MD  Guidance:  ULTRASOUND FLUOROSCOPIC  FLUOROSCOPY TIME:  9 MINUTES 36 SECONDS, 1,024 MGY  MEDICATIONS AND MEDICAL HISTORY: 1 mg Versed, 50 mcg fentanyl  ANESTHESIA/SEDATION: 30 minutes  CONTRAST:  155mL OMNIPAQUE IOHEXOL 300 MG/ML  SOLN  COMPLICATIONS: None immediate  PROCEDURE: Informed consent was obtained from the patient following explanation of the procedure, risks, benefits and alternatives. The patient understands, agrees and consents for the procedure. All questions were addressed. A time out was performed.  Maximal barrier sterile technique utilized including caps, mask, sterile gowns, sterile gloves, large sterile drape, hand hygiene, and ChloraPrep.  Under sterile conditions and local anesthesia, ultrasound micropuncture access performed of the right common femoral artery. Five French sheath inserted over a Bentson guidewire. C2 catheter advanced over guidewire and utilized initially to select the celiac artery. Selective celiac angiogram performed.  Celiac: Celiac origin is patent. Splenic, left gastric and hepatic vasculature are patent. Gastroduodenal artery patent. No active upper GI tract bleeding demonstrated.  Catheter was retracted and utilized to select the SMA origin. Selective SMA angiogram performed.  SMA: SMA origin is patent. Main SMA trunk is patent. Jejunal and colic branches appear patent. No evidence of active bleeding.  Catheter was retracted and exchanged for a Sos Omni select catheter. This catheter was utilized to select the IMA origin.  IMA: IMA main trunk is patent. The left colic and superior hemorrhoidal branches are all patent. No evidence of active bleeding.  Multiple attempts were made to access the left colic branches with a micro catheter and micro guidewire from the IMA origin. However the catheter and guidewire would not easily advance peripherally into the IMA.  Repeat injection of the IMA demonstrates a small proximal IMA dissection. This is minimally flow limiting. No thrombus. Because of this, the procedure was stopped.  IMPRESSION: Successful celiac, SMA and IMA angiograms without evidence of active colonic lower GI bleeding.  Minor proximal IMA dissection related to catheter and guidewire manipulation without occlusion.   Electronically Signed   By: Jerilynn Mages.  Shick M.D.   On: 12/24/2014 15:47   Ir US Guide Vasc Access Right  12/24/2014   CLINICAL DATA:  Acute lower GI bleeding by nuclear medicine exam involving the proximal descending colon.  EXAM: ULTRASOUND GUIDANCE FOR VASCULAR ACCESS  CELIAC, SMA, AND IMA CATHETERIZATIONS AND ANGIOGRAMS  Date:  8/27/20168/27/2016 2:58 pm  Radiologist:  M. Daryll Brod, MD  Guidance:  ULTRASOUND FLUOROSCOPIC  FLUOROSCOPY TIME:  9 MINUTES 36 SECONDS, 1,024 MGY  MEDICATIONS AND MEDICAL HISTORY: 1 mg Versed, 50 mcg fentanyl  ANESTHESIA/SEDATION: 30 minutes  CONTRAST:  171mL OMNIPAQUE IOHEXOL 300 MG/ML  SOLN  COMPLICATIONS: None immediate  PROCEDURE: Informed consent was obtained from the patient following explanation of the procedure, risks, benefits and alternatives. The patient understands, agrees and consents for the procedure. All questions were addressed. A time out was performed.  Maximal barrier sterile technique utilized including caps, mask, sterile gowns, sterile gloves, large sterile drape, hand hygiene, and ChloraPrep.  Under sterile conditions and local anesthesia, ultrasound micropuncture access performed of the right common femoral artery. Five French sheath inserted over a Bentson guidewire. C2 catheter advanced over guidewire and utilized initially to select the celiac artery. Selective celiac angiogram performed.  Celiac: Celiac origin is patent. Splenic, left gastric and hepatic vasculature are patent. Gastroduodenal artery patent. No active upper GI tract bleeding demonstrated.  Catheter was retracted and utilized to  select the SMA origin. Selective SMA angiogram performed.  SMA: SMA origin is patent. Main SMA trunk is patent. Jejunal and colic branches appear patent. No evidence of active bleeding.  Catheter was retracted and exchanged for a Sos Omni select catheter. This catheter was utilized to select the IMA origin.  IMA: IMA main trunk is patent. The left colic and superior hemorrhoidal branches are all patent. No evidence of active bleeding.  Multiple attempts were made to access the left colic branches with a micro catheter and micro guidewire from the IMA origin. However the catheter and guidewire would not easily advance peripherally into the IMA. Repeat injection of the IMA demonstrates a small proximal IMA dissection. This is minimally flow limiting. No thrombus. Because of this, the procedure was stopped.  IMPRESSION: Successful celiac, SMA and IMA angiograms without evidence of active colonic lower GI bleeding.  Minor proximal IMA dissection related to catheter and guidewire manipulation without occlusion.   Electronically Signed   By: Jerilynn Mages.  Shick M.D.   On: 12/24/2014 15:47   Dg Chest Port 1 View  12/24/2014   CLINICAL DATA:  Doctor attempted central line placement on the left side. Ended up putting it in the femoral.  EXAM: PORTABLE CHEST - 1 VIEW  COMPARISON:  06/22/2011  FINDINGS: No  pneumothorax following attempted central line placement.  Lungs are clear.  Heart, mediastinum and hila are unremarkable.  Old rib fractures on the left.  IMPRESSION: No acute cardiopulmonary disease.  No pneumothorax.   Electronically Signed   By: Lajean Manes M.D.   On: 12/24/2014 12:46    Labs:  CBC:  Recent Labs  12/24/14 1515 12/24/14 2131 12/25/14 0230 12/25/14 0526  WBC 14.3* 11.0* 10.0 9.2  HGB 9.9* 8.8* 8.5* 8.1*  HCT 29.3* 25.5* 24.6* 23.7*  PLT 154 154 147* 136*    COAGS:  Recent Labs  12/24/14 0836 12/24/14 1515  INR 1.16 1.42  APTT 24 27    BMP:  Recent Labs  12/23/14 1658  12/24/14 0253 12/24/14 1515 12/25/14 0526  NA 141 140 141 139  K 3.9 3.7 4.7 3.6  CL 105 105 113* 108  CO2 30 29 25 27   GLUCOSE 117* 112* 134* 123*  BUN 19 17 18 15   CALCIUM 9.4 8.7* 7.0* 7.3*  CREATININE 0.85 0.67 1.17 0.85  GFRNONAA >60 >60 >60 >60  GFRAA >60 >60 >60 >60    LIVER FUNCTION TESTS:  Recent Labs  12/23/14 1658 12/24/14 1515  BILITOT 0.7 1.2  AST 35 25  ALT 38 26  ALKPHOS 47 30*  PROT 6.1* 3.8*  ALBUMIN 3.7 2.2*    Assessment and Plan:  S/P Mesenteric Arteriogram without evidence of active colonic lower GI bleeding by Dr. Annamaria Boots on 12/24/14  OK to advance diet from our standpoint  [place] to remove left femoral central line if OK with other providers involved and patient has adequate peripheral IV access.  Remove groin dressing tomorrow.  Signed: Murrell Redden PA-C 12/25/2014, 10:32 AM   I spent a total of 15 Minutes at the the patient's bedside AND on the patient's hospital floor or unit, greater than 50% of which was counseling/coordinating care for post op mesenteric agram.

## 2014-12-25 NOTE — Progress Notes (Signed)
eLink Physician-Brief Progress Note Patient Name: KEYWON MESTRE DOB: 1945/11/11 MRN: [id number]   Date of Service  12/25/2014  HPI/Events of Note  Hgb = 7.1. BP = 130/49. HR = 99. No evidence of active bleeding.   eICU Interventions  Continue present management. Transfuse for active bleeding or Hgb > 7.0.     Intervention Category Intermediate Interventions: Other:  Lysle Dingwall 12/25/2014, 9:46 PM

## 2014-12-25 NOTE — Progress Notes (Signed)
Results for Shawn Lowery, Shawn Lowery (MRN [id number]) as of 12/25/2014 21:13  Ref. Range 12/25/2014 19:26  Hemoglobin Latest Ref Range: 13.0-17.0 g/dL 7.1 (L)  Will update MD.

## 2014-12-25 NOTE — Progress Notes (Signed)
[facility] PROGRESS NOTE Subjective No gross bleeding overnight +flatus.  Objective: Vital signs in last 24 hours: Temp:  [97.2 F (36.2 C)-98.7 F (37.1 C)] 98.7 F (37.1 C) (08/28 0351) Pulse Rate:  [62-114] 85 (08/28 0500) Resp:  [9-21] 10 (08/28 0500) BP: (68-136)/(37-65) 125/49 mmHg (08/28 0500) SpO2:  [84 %-100 %] 99 % (08/28 0500) Weight:  [91.5 kg (201 lb 11.5 oz)] 91.5 kg (201 lb 11.5 oz) (08/28 0500) Last BM Date: 12/24/14  Intake/Output from previous day: 08/27 0701 - 08/28 0700 In: 1595 [P.O.:120; I.V.:1475] Out: 950 [Urine:950] Intake/Output this shift:    PE: General--NAD  Abdomen--nondistended, soft, nontender  Lab Results:  Recent Labs  12/24/14 0253 12/24/14 0822 12/24/14 1515 12/24/14 2131 12/25/14 0230 12/25/14 0526  WBC 7.6  --  14.3* 11.0* 10.0 9.2  HGB 12.5* 10.1* 9.9* 8.8* 8.5* 8.1*  HCT 37.5*  --  29.3* 25.5* 24.6* 23.7*  PLT 186  --  154 154 147* 136*   BMET  Recent Labs  12/23/14 1658 12/24/14 0253 12/24/14 1515 12/25/14 0526  NA 141 140 141 139  K 3.9 3.7 4.7 3.6  CL 105 105 113* 108  CO2 30 29 25 27   CREATININE 0.85 0.67 1.17 0.85   LFT  Recent Labs  12/23/14 1658 12/24/14 1515  PROT 6.1* 3.8*  AST 35 25  ALT 38 26  ALKPHOS 47 30*  BILITOT 0.7 1.2   PT/INR  Recent Labs  12/24/14 0836 12/24/14 1515  LABPROT 15.0 17.4*  INR 1.16 1.42   PANCREAS No results for input(s): LIPASE in the last 72 hours.       Studies/Results: Ct Head Wo Contrast  12/24/2014   CLINICAL DATA:  Admitted for acute lower GI bleed, now with syncopal episode.  EXAM: CT HEAD WITHOUT CONTRAST  TECHNIQUE: Contiguous axial images were obtained from the base of the skull through the vertex without intravenous contrast.  COMPARISON:  None.  FINDINGS: Regional soft tissues appear normal. No radiopaque foreign body. No definite displaced calvarial fracture.  There is mild atrophy with mild diffuse sulcal prominence. Gray-white  differentiation is maintained. No CT evidence of acute large territory infarct. No intraparenchymal or extra-axial mass or hemorrhage. Normal size and configuration of the ventricles and basilar cisterns. No midline shift.  There is mild mucosal thickening involving the bilateral frontal sinuses as well as the anterior and posterior ethmoidal air cells. Polypoid mucosal thickening of the left maxillary sinus. No air-fluid levels. Mastoid air cells are normally aerated.  IMPRESSION: 1. Mild atrophy without acute intracranial process. 2. Sinus disease as above.  No air-fluid levels.   Electronically Signed   By: Sandi Mariscal M.D.   On: 12/24/2014 09:40   Nm Gi Blood Loss  12/24/2014   CLINICAL DATA:  Active GI bleeding for 2 days, history diverticulitis, syncopal episodes, hypotensive, history hypertension  EXAM: NUCLEAR MEDICINE GASTROINTESTINAL BLEEDING SCAN  TECHNIQUE: Sequential abdominal images were obtained following intravenous administration of Tc-44m labeled red blood cells.  RADIOPHARMACEUTICALS:  23.3 mCi Tc-44m in-vitro labeled autologous red cells.  COMPARISON:  None; correlation made with CT abdomen 08/15/2009  FINDINGS: Normal initial blood pool distribution of tracer.  Within the first 10 minutes, abnormal GI tracer localization is seen in the lateral LEFT abdomen.  This tracer descends along the expected course of the descending colon into sigmoid colon.  This most likely represents a site of active GI bleeding at the proximal descending colon.  Later images demonstrate localization of a moderate amount of  tracer within a loop in the lateral LEFT abdomen, suspect retrograde tracer passage into a redundant distal transverse colon.  IMPRESSION: Positive GI bleeding exam for a site of active GI bleeding at the proximal descending colon.  Findings called to [place] in ICU on 12/24/2014 at 1120 hours.   Electronically Signed   By: Lavonia Dana M.D.   On: 12/24/2014 11:21   Ir Angiogram Visceral  Selective  12/24/2014   CLINICAL DATA:  Acute lower GI bleeding by nuclear medicine exam involving the proximal descending colon.  EXAM: ULTRASOUND GUIDANCE FOR VASCULAR ACCESS  CELIAC, SMA, AND IMA CATHETERIZATIONS AND ANGIOGRAMS  Date:  8/27/20168/27/2016 2:58 pm  Radiologist:  M. Daryll Brod, MD  Guidance:  ULTRASOUND FLUOROSCOPIC  FLUOROSCOPY TIME:  9 MINUTES 36 SECONDS, 1,024 MGY  MEDICATIONS AND MEDICAL HISTORY: 1 mg Versed, 50 mcg fentanyl  ANESTHESIA/SEDATION: 30 minutes  CONTRAST:  140mL OMNIPAQUE IOHEXOL 300 MG/ML  SOLN  COMPLICATIONS: None immediate  PROCEDURE: Informed consent was obtained from the patient following explanation of the procedure, risks, benefits and alternatives. The patient understands, agrees and consents for the procedure. All questions were addressed. A time out was performed.  Maximal barrier sterile technique utilized including caps, mask, sterile gowns, sterile gloves, large sterile drape, hand hygiene, and ChloraPrep.  Under sterile conditions and local anesthesia, ultrasound micropuncture access performed of the right common femoral artery. Five French sheath inserted over a Bentson guidewire. C2 catheter advanced over guidewire and utilized initially to select the celiac artery. Selective celiac angiogram performed.  Celiac: Celiac origin is patent. Splenic, left gastric and hepatic vasculature are patent. Gastroduodenal artery patent. No active upper GI tract bleeding demonstrated.  Catheter was retracted and utilized to select the SMA origin. Selective SMA angiogram performed.  SMA: SMA origin is patent. Main SMA trunk is patent. Jejunal and colic branches appear patent. No evidence of active bleeding.  Catheter was retracted and exchanged for a Sos Omni select catheter. This catheter was utilized to select the IMA origin.  IMA: IMA main trunk is patent. The left colic and superior hemorrhoidal branches are all patent. No evidence of active bleeding.  Multiple attempts were  made to access the left colic branches with a micro catheter and micro guidewire from the IMA origin. However the catheter and guidewire would not easily advance peripherally into the IMA. Repeat injection of the IMA demonstrates a small proximal IMA dissection. This is minimally flow limiting. No thrombus. Because of this, the procedure was stopped.  IMPRESSION: Successful celiac, SMA and IMA angiograms without evidence of active colonic lower GI bleeding.  Minor proximal IMA dissection related to catheter and guidewire manipulation without occlusion.   Electronically Signed   By: Jerilynn Mages.  Shick M.D.   On: 12/24/2014 15:47   Ir Angiogram Visceral Selective  12/24/2014   CLINICAL DATA:  Acute lower GI bleeding by nuclear medicine exam involving the proximal descending colon.  EXAM: ULTRASOUND GUIDANCE FOR VASCULAR ACCESS  CELIAC, SMA, AND IMA CATHETERIZATIONS AND ANGIOGRAMS  Date:  8/27/20168/27/2016 2:58 pm  Radiologist:  M. Daryll Brod, MD  Guidance:  ULTRASOUND FLUOROSCOPIC  FLUOROSCOPY TIME:  9 MINUTES 36 SECONDS, 1,024 MGY  MEDICATIONS AND MEDICAL HISTORY: 1 mg Versed, 50 mcg fentanyl  ANESTHESIA/SEDATION: 30 minutes  CONTRAST:  161mL OMNIPAQUE IOHEXOL 300 MG/ML  SOLN  COMPLICATIONS: None immediate  PROCEDURE: Informed consent was obtained from the patient following explanation of the procedure, risks, benefits and alternatives. The patient understands, agrees and consents for the procedure. All questions  were addressed. A time out was performed.  Maximal barrier sterile technique utilized including caps, mask, sterile gowns, sterile gloves, large sterile drape, hand hygiene, and ChloraPrep.  Under sterile conditions and local anesthesia, ultrasound micropuncture access performed of the right common femoral artery. Five French sheath inserted over a Bentson guidewire. C2 catheter advanced over guidewire and utilized initially to select the celiac artery. Selective celiac angiogram performed.  Celiac: Celiac  origin is patent. Splenic, left gastric and hepatic vasculature are patent. Gastroduodenal artery patent. No active upper GI tract bleeding demonstrated.  Catheter was retracted and utilized to select the SMA origin. Selective SMA angiogram performed.  SMA: SMA origin is patent. Main SMA trunk is patent. Jejunal and colic branches appear patent. No evidence of active bleeding.  Catheter was retracted and exchanged for a Sos Omni select catheter. This catheter was utilized to select the IMA origin.  IMA: IMA main trunk is patent. The left colic and superior hemorrhoidal branches are all patent. No evidence of active bleeding.  Multiple attempts were made to access the left colic branches with a micro catheter and micro guidewire from the IMA origin. However the catheter and guidewire would not easily advance peripherally into the IMA. Repeat injection of the IMA demonstrates a small proximal IMA dissection. This is minimally flow limiting. No thrombus. Because of this, the procedure was stopped.  IMPRESSION: Successful celiac, SMA and IMA angiograms without evidence of active colonic lower GI bleeding.  Minor proximal IMA dissection related to catheter and guidewire manipulation without occlusion.   Electronically Signed   By: Jerilynn Mages.  Shick M.D.   On: 12/24/2014 15:47   Ir Angiogram Visceral Selective  12/24/2014   CLINICAL DATA:  Acute lower GI bleeding by nuclear medicine exam involving the proximal descending colon.  EXAM: ULTRASOUND GUIDANCE FOR VASCULAR ACCESS  CELIAC, SMA, AND IMA CATHETERIZATIONS AND ANGIOGRAMS  Date:  8/27/20168/27/2016 2:58 pm  Radiologist:  M. Daryll Brod, MD  Guidance:  ULTRASOUND FLUOROSCOPIC  FLUOROSCOPY TIME:  9 MINUTES 36 SECONDS, 1,024 MGY  MEDICATIONS AND MEDICAL HISTORY: 1 mg Versed, 50 mcg fentanyl  ANESTHESIA/SEDATION: 30 minutes  CONTRAST:  188mL OMNIPAQUE IOHEXOL 300 MG/ML  SOLN  COMPLICATIONS: None immediate  PROCEDURE: Informed consent was obtained from the patient following  explanation of the procedure, risks, benefits and alternatives. The patient understands, agrees and consents for the procedure. All questions were addressed. A time out was performed.  Maximal barrier sterile technique utilized including caps, mask, sterile gowns, sterile gloves, large sterile drape, hand hygiene, and ChloraPrep.  Under sterile conditions and local anesthesia, ultrasound micropuncture access performed of the right common femoral artery. Five French sheath inserted over a Bentson guidewire. C2 catheter advanced over guidewire and utilized initially to select the celiac artery. Selective celiac angiogram performed.  Celiac: Celiac origin is patent. Splenic, left gastric and hepatic vasculature are patent. Gastroduodenal artery patent. No active upper GI tract bleeding demonstrated.  Catheter was retracted and utilized to select the SMA origin. Selective SMA angiogram performed.  SMA: SMA origin is patent. Main SMA trunk is patent. Jejunal and colic branches appear patent. No evidence of active bleeding.  Catheter was retracted and exchanged for a Sos Omni select catheter. This catheter was utilized to select the IMA origin.  IMA: IMA main trunk is patent. The left colic and superior hemorrhoidal branches are all patent. No evidence of active bleeding.  Multiple attempts were made to access the left colic branches with a micro catheter and micro guidewire from the  IMA origin. However the catheter and guidewire would not easily advance peripherally into the IMA. Repeat injection of the IMA demonstrates a small proximal IMA dissection. This is minimally flow limiting. No thrombus. Because of this, the procedure was stopped.  IMPRESSION: Successful celiac, SMA and IMA angiograms without evidence of active colonic lower GI bleeding.  Minor proximal IMA dissection related to catheter and guidewire manipulation without occlusion.   Electronically Signed   By: Jerilynn Mages.  Shick M.D.   On: 12/24/2014 15:47   Ir US  Guide Vasc Access Right  12/24/2014   CLINICAL DATA:  Acute lower GI bleeding by nuclear medicine exam involving the proximal descending colon.  EXAM: ULTRASOUND GUIDANCE FOR VASCULAR ACCESS  CELIAC, SMA, AND IMA CATHETERIZATIONS AND ANGIOGRAMS  Date:  8/27/20168/27/2016 2:58 pm  Radiologist:  M. Daryll Brod, MD  Guidance:  ULTRASOUND FLUOROSCOPIC  FLUOROSCOPY TIME:  9 MINUTES 36 SECONDS, 1,024 MGY  MEDICATIONS AND MEDICAL HISTORY: 1 mg Versed, 50 mcg fentanyl  ANESTHESIA/SEDATION: 30 minutes  CONTRAST:  144mL OMNIPAQUE IOHEXOL 300 MG/ML  SOLN  COMPLICATIONS: None immediate  PROCEDURE: Informed consent was obtained from the patient following explanation of the procedure, risks, benefits and alternatives. The patient understands, agrees and consents for the procedure. All questions were addressed. A time out was performed.  Maximal barrier sterile technique utilized including caps, mask, sterile gowns, sterile gloves, large sterile drape, hand hygiene, and ChloraPrep.  Under sterile conditions and local anesthesia, ultrasound micropuncture access performed of the right common femoral artery. Five French sheath inserted over a Bentson guidewire. C2 catheter advanced over guidewire and utilized initially to select the celiac artery. Selective celiac angiogram performed.  Celiac: Celiac origin is patent. Splenic, left gastric and hepatic vasculature are patent. Gastroduodenal artery patent. No active upper GI tract bleeding demonstrated.  Catheter was retracted and utilized to select the SMA origin. Selective SMA angiogram performed.  SMA: SMA origin is patent. Main SMA trunk is patent. Jejunal and colic branches appear patent. No evidence of active bleeding.  Catheter was retracted and exchanged for a Sos Omni select catheter. This catheter was utilized to select the IMA origin.  IMA: IMA main trunk is patent. The left colic and superior hemorrhoidal branches are all patent. No evidence of active bleeding.  Multiple  attempts were made to access the left colic branches with a micro catheter and micro guidewire from the IMA origin. However the catheter and guidewire would not easily advance peripherally into the IMA. Repeat injection of the IMA demonstrates a small proximal IMA dissection. This is minimally flow limiting. No thrombus. Because of this, the procedure was stopped.  IMPRESSION: Successful celiac, SMA and IMA angiograms without evidence of active colonic lower GI bleeding.  Minor proximal IMA dissection related to catheter and guidewire manipulation without occlusion.   Electronically Signed   By: Jerilynn Mages.  Shick M.D.   On: 12/24/2014 15:47   Dg Chest Port 1 View  12/24/2014   CLINICAL DATA:  Doctor attempted central line placement on the left side. Ended up putting it in the femoral.  EXAM: PORTABLE CHEST - 1 VIEW  COMPARISON:  06/22/2011  FINDINGS: No pneumothorax following attempted central line placement.  Lungs are clear.  Heart, mediastinum and hila are unremarkable.  Old rib fractures on the left.  IMPRESSION: No acute cardiopulmonary disease.  No pneumothorax.   Electronically Signed   By: Lajean Manes M.D.   On: 12/24/2014 12:46    Medications: I have reviewed the patient's current medications.  Assessment/Plan:  1. LGI Bleed. Stable overnight, on clears and miralax, Will continue ? Advance next few days ? Increase activity. Apparently due for screening colon in [place] next few years ? Do now if continued bleeding.  Corabelle Spackman JR,Alfio Loescher L 12/25/2014, 8:02 AM  Pager: [phone] If no answer or after hours call [phone]

## 2014-12-25 NOTE — Progress Notes (Signed)
12/25/2014 4:14 PM  Pt transferred from ICU via wheelchair to 6E04 accompanied by his nurse.  Pt is fully alert and oriented, ambulates with one assist.  Pt currently denies pain.  Skin intact.  Full assessment to EPIC.  Tele box 4 placed on patient and confirmed with CCMD, currently NSR.  Vitals stable.  Pt is a high fall risk per protocol due to falls history, however, is refusing a bed alarm or even to call for help if he needs it despite education.  Pt states that he is fine to move around on his own, that he needs to walk frequently, and that if he needs to use the restroom he will not wait on anyone he is just going to go.  I again reiterated his fall risk and the interventions indicated by the falls bundle, to which he verbalized understanding but again stated that he did not wish to follow them.  Pt oriented to room/unit, and was instructed on how to utilize the call bell, to which he verbalized understanding.  Will continue to monitor patient. Princella Pellegrini

## 2014-12-25 NOTE — Progress Notes (Addendum)
PULMONARY / CRITICAL CARE MEDICINE   Name: Shawn Lowery MRN: [id number] DOB: 05-02-1945    ADMISSION DATE:  12/24/2014   REFERRING MD :  [facility]  CHIEF COMPLAINT: Bloody stools  INITIAL PRESENTATION:  69 yo former smoker with PMH of HTN, diverticulosis, diverticulitis who presented to [facility] 8/26 /16 with new 6-7 bloody stools. He has NUC MED GI study which revealed(active GI bleeding at the proximal descending colon). [place] unable to perform coiling of offending vessel and he is being transferred to [facility] for IR coiling. PCCM will be the admitting doctor and he can be transferred to [facility] once stabilized. Note he had near syncopal episode prior to admit. STUDIES:  [facility]  SIGNIFICANT EVENTS: 8/27 tx to [facility]   SUBJECTIVE:  12/25/14 : Seen by GI and d/w Dr Oletta Lamas today at bedside.  -  Bleeding pattern c/w diverticular. Says Miralax best choice and hope for best. Usually good prognosis.  Family and patient report feeling well and no further bleeding. Currently passed stool -> mostly flatus and old blood as witnessed by RN, MD. patient and wife. . Patient wants to get up and move about and very keen on this. Refused helop with commode  VITAL SIGNS: Temp:  [97.6 F (36.4 C)-98.7 F (37.1 C)] 97.6 F (36.4 C) (08/28 0807) Pulse Rate:  [62-114] 85 (08/28 0500) Resp:  [9-21] 10 (08/28 0500) BP: (68-136)/(37-65) 125/49 mmHg (08/28 0500) SpO2:  [84 %-100 %] 99 % (08/28 0500) Weight:  [91.5 kg (201 lb 11.5 oz)] 91.5 kg (201 lb 11.5 oz) (08/28 0500) HEMODYNAMICS:   VENTILATOR SETTINGS:   INTAKE / OUTPUT:  Intake/Output Summary (Last 24 hours) at 12/25/14 0901 Last data filed at 12/25/14 0600  Gross per 24 hour  Intake   1595 ml  Output    950 ml  Net    645 ml    PHYSICAL EXAMINATION: General:  WNWDWF NAD Neuro:  Intact. Oriented x 3. CAM-ICU negative for delirium. Moves all 4s. RASS 0 equivalent HEENT:  No JVD/LAN Cardiovascular:  HSR RRR Lungs:  CTA Abdomen:   Tender Musculoskeletal:  Intact Skin:  Warm and dry  LABS:  CBC  Recent Labs Lab 12/24/14 2131 12/25/14 0230 12/25/14 0526  WBC 11.0* 10.0 9.2  HGB 8.8* 8.5* 8.1*  HCT 25.5* 24.6* 23.7*  PLT 154 147* 136*   Coag's  Recent Labs Lab 12/24/14 0836 12/24/14 1515  APTT 24 27  INR 1.16 1.42   BMET  Recent Labs Lab 12/24/14 0253 12/24/14 1515 12/25/14 0526  NA 140 141 139  K 3.7 4.7 3.6  CL 105 113* 108  CO2 29 25 27   BUN 17 18 15   CREATININE 0.67 1.17 0.85  GLUCOSE 112* 134* 123*   Electrolytes  Recent Labs Lab 12/24/14 0253 12/24/14 1515 12/25/14 0526  CALCIUM 8.7* 7.0* 7.3*  MG  --  1.5*  --   PHOS  --  3.1  --    Sepsis Markers  Recent Labs Lab 12/24/14 1515  LATICACIDVEN 1.6  PROCALCITON <0.10   ABG  Recent Labs Lab 12/24/14 1549  PHART 7.303*  PCO2ART 42.3  PO2ART 79.0*   Liver Enzymes  Recent Labs Lab 12/23/14 1658 12/24/14 1515  AST 35 25  ALT 38 26  ALKPHOS 47 30*  BILITOT 0.7 1.2  ALBUMIN 3.7 2.2*   Cardiac Enzymes  Recent Labs Lab 12/24/14 1515 12/24/14 2330  TROPONINI <0.03 <0.03   Glucose  Recent Labs Lab 12/24/14 1553  GLUCAP 121*  Imaging Ct Head Wo Contrast  12/24/2014   CLINICAL DATA:  Admitted for acute lower GI bleed, now with syncopal episode.  EXAM: CT HEAD WITHOUT CONTRAST  TECHNIQUE: Contiguous axial images were obtained from the base of the skull through the vertex without intravenous contrast.  COMPARISON:  None.  FINDINGS: Regional soft tissues appear normal. No radiopaque foreign body. No definite displaced calvarial fracture.  There is mild atrophy with mild diffuse sulcal prominence. Gray-white differentiation is maintained. No CT evidence of acute large territory infarct. No intraparenchymal or extra-axial mass or hemorrhage. Normal size and configuration of the ventricles and basilar cisterns. No midline shift.  There is mild mucosal thickening involving the bilateral frontal sinuses as well  as the anterior and posterior ethmoidal air cells. Polypoid mucosal thickening of the left maxillary sinus. No air-fluid levels. Mastoid air cells are normally aerated.  IMPRESSION: 1. Mild atrophy without acute intracranial process. 2. Sinus disease as above.  No air-fluid levels.   Electronically Signed   By: Sandi Mariscal M.D.   On: 12/24/2014 09:40   Nm Gi Blood Loss  12/24/2014   CLINICAL DATA:  Active GI bleeding for 2 days, history diverticulitis, syncopal episodes, hypotensive, history hypertension  EXAM: NUCLEAR MEDICINE GASTROINTESTINAL BLEEDING SCAN  TECHNIQUE: Sequential abdominal images were obtained following intravenous administration of Tc-23m labeled red blood cells.  RADIOPHARMACEUTICALS:  23.3 mCi Tc-57m in-vitro labeled autologous red cells.  COMPARISON:  None; correlation made with CT abdomen 08/15/2009  FINDINGS: Normal initial blood pool distribution of tracer.  Within the first 10 minutes, abnormal GI tracer localization is seen in the lateral LEFT abdomen.  This tracer descends along the expected course of the descending colon into sigmoid colon.  This most likely represents a site of active GI bleeding at the proximal descending colon.  Later images demonstrate localization of a moderate amount of tracer within a loop in the lateral LEFT abdomen, suspect retrograde tracer passage into a redundant distal transverse colon.  IMPRESSION: Positive GI bleeding exam for a site of active GI bleeding at the proximal descending colon.  Findings called to [place] in ICU on 12/24/2014 at 1120 hours.   Electronically Signed   By: Lavonia Dana M.D.   On: 12/24/2014 11:21   Ir Angiogram Visceral Selective  12/24/2014   CLINICAL DATA:  Acute lower GI bleeding by nuclear medicine exam involving the proximal descending colon.  EXAM: ULTRASOUND GUIDANCE FOR VASCULAR ACCESS  CELIAC, SMA, AND IMA CATHETERIZATIONS AND ANGIOGRAMS  Date:  8/27/20168/27/2016 2:58 pm  Radiologist:  M. Daryll Brod, MD  Guidance:   ULTRASOUND FLUOROSCOPIC  FLUOROSCOPY TIME:  9 MINUTES 36 SECONDS, 1,024 MGY  MEDICATIONS AND MEDICAL HISTORY: 1 mg Versed, 50 mcg fentanyl  ANESTHESIA/SEDATION: 30 minutes  CONTRAST:  128mL OMNIPAQUE IOHEXOL 300 MG/ML  SOLN  COMPLICATIONS: None immediate  PROCEDURE: Informed consent was obtained from the patient following explanation of the procedure, risks, benefits and alternatives. The patient understands, agrees and consents for the procedure. All questions were addressed. A time out was performed.  Maximal barrier sterile technique utilized including caps, mask, sterile gowns, sterile gloves, large sterile drape, hand hygiene, and ChloraPrep.  Under sterile conditions and local anesthesia, ultrasound micropuncture access performed of the right common femoral artery. Five French sheath inserted over a Bentson guidewire. C2 catheter advanced over guidewire and utilized initially to select the celiac artery. Selective celiac angiogram performed.  Celiac: Celiac origin is patent. Splenic, left gastric and hepatic vasculature are patent. Gastroduodenal artery patent. No  active upper GI tract bleeding demonstrated.  Catheter was retracted and utilized to select the SMA origin. Selective SMA angiogram performed.  SMA: SMA origin is patent. Main SMA trunk is patent. Jejunal and colic branches appear patent. No evidence of active bleeding.  Catheter was retracted and exchanged for a Sos Omni select catheter. This catheter was utilized to select the IMA origin.  IMA: IMA main trunk is patent. The left colic and superior hemorrhoidal branches are all patent. No evidence of active bleeding.  Multiple attempts were made to access the left colic branches with a micro catheter and micro guidewire from the IMA origin. However the catheter and guidewire would not easily advance peripherally into the IMA. Repeat injection of the IMA demonstrates a small proximal IMA dissection. This is minimally flow limiting. No thrombus.  Because of this, the procedure was stopped.  IMPRESSION: Successful celiac, SMA and IMA angiograms without evidence of active colonic lower GI bleeding.  Minor proximal IMA dissection related to catheter and guidewire manipulation without occlusion.   Electronically Signed   By: Jerilynn Mages.  Shick M.D.   On: 12/24/2014 15:47   Ir Angiogram Visceral Selective  12/24/2014   CLINICAL DATA:  Acute lower GI bleeding by nuclear medicine exam involving the proximal descending colon.  EXAM: ULTRASOUND GUIDANCE FOR VASCULAR ACCESS  CELIAC, SMA, AND IMA CATHETERIZATIONS AND ANGIOGRAMS  Date:  8/27/20168/27/2016 2:58 pm  Radiologist:  M. Daryll Brod, MD  Guidance:  ULTRASOUND FLUOROSCOPIC  FLUOROSCOPY TIME:  9 MINUTES 36 SECONDS, 1,024 MGY  MEDICATIONS AND MEDICAL HISTORY: 1 mg Versed, 50 mcg fentanyl  ANESTHESIA/SEDATION: 30 minutes  CONTRAST:  121mL OMNIPAQUE IOHEXOL 300 MG/ML  SOLN  COMPLICATIONS: None immediate  PROCEDURE: Informed consent was obtained from the patient following explanation of the procedure, risks, benefits and alternatives. The patient understands, agrees and consents for the procedure. All questions were addressed. A time out was performed.  Maximal barrier sterile technique utilized including caps, mask, sterile gowns, sterile gloves, large sterile drape, hand hygiene, and ChloraPrep.  Under sterile conditions and local anesthesia, ultrasound micropuncture access performed of the right common femoral artery. Five French sheath inserted over a Bentson guidewire. C2 catheter advanced over guidewire and utilized initially to select the celiac artery. Selective celiac angiogram performed.  Celiac: Celiac origin is patent. Splenic, left gastric and hepatic vasculature are patent. Gastroduodenal artery patent. No active upper GI tract bleeding demonstrated.  Catheter was retracted and utilized to select the SMA origin. Selective SMA angiogram performed.  SMA: SMA origin is patent. Main SMA trunk is patent. Jejunal  and colic branches appear patent. No evidence of active bleeding.  Catheter was retracted and exchanged for a Sos Omni select catheter. This catheter was utilized to select the IMA origin.  IMA: IMA main trunk is patent. The left colic and superior hemorrhoidal branches are all patent. No evidence of active bleeding.  Multiple attempts were made to access the left colic branches with a micro catheter and micro guidewire from the IMA origin. However the catheter and guidewire would not easily advance peripherally into the IMA. Repeat injection of the IMA demonstrates a small proximal IMA dissection. This is minimally flow limiting. No thrombus. Because of this, the procedure was stopped.  IMPRESSION: Successful celiac, SMA and IMA angiograms without evidence of active colonic lower GI bleeding.  Minor proximal IMA dissection related to catheter and guidewire manipulation without occlusion.   Electronically Signed   By: Jerilynn Mages.  Shick M.D.   On: 12/24/2014 15:47  Ir Angiogram Visceral Selective  12/24/2014   CLINICAL DATA:  Acute lower GI bleeding by nuclear medicine exam involving the proximal descending colon.  EXAM: ULTRASOUND GUIDANCE FOR VASCULAR ACCESS  CELIAC, SMA, AND IMA CATHETERIZATIONS AND ANGIOGRAMS  Date:  8/27/20168/27/2016 2:58 pm  Radiologist:  M. Daryll Brod, MD  Guidance:  ULTRASOUND FLUOROSCOPIC  FLUOROSCOPY TIME:  9 MINUTES 36 SECONDS, 1,024 MGY  MEDICATIONS AND MEDICAL HISTORY: 1 mg Versed, 50 mcg fentanyl  ANESTHESIA/SEDATION: 30 minutes  CONTRAST:  157mL OMNIPAQUE IOHEXOL 300 MG/ML  SOLN  COMPLICATIONS: None immediate  PROCEDURE: Informed consent was obtained from the patient following explanation of the procedure, risks, benefits and alternatives. The patient understands, agrees and consents for the procedure. All questions were addressed. A time out was performed.  Maximal barrier sterile technique utilized including caps, mask, sterile gowns, sterile gloves, large sterile drape, hand hygiene,  and ChloraPrep.  Under sterile conditions and local anesthesia, ultrasound micropuncture access performed of the right common femoral artery. Five French sheath inserted over a Bentson guidewire. C2 catheter advanced over guidewire and utilized initially to select the celiac artery. Selective celiac angiogram performed.  Celiac: Celiac origin is patent. Splenic, left gastric and hepatic vasculature are patent. Gastroduodenal artery patent. No active upper GI tract bleeding demonstrated.  Catheter was retracted and utilized to select the SMA origin. Selective SMA angiogram performed.  SMA: SMA origin is patent. Main SMA trunk is patent. Jejunal and colic branches appear patent. No evidence of active bleeding.  Catheter was retracted and exchanged for a Sos Omni select catheter. This catheter was utilized to select the IMA origin.  IMA: IMA main trunk is patent. The left colic and superior hemorrhoidal branches are all patent. No evidence of active bleeding.  Multiple attempts were made to access the left colic branches with a micro catheter and micro guidewire from the IMA origin. However the catheter and guidewire would not easily advance peripherally into the IMA. Repeat injection of the IMA demonstrates a small proximal IMA dissection. This is minimally flow limiting. No thrombus. Because of this, the procedure was stopped.  IMPRESSION: Successful celiac, SMA and IMA angiograms without evidence of active colonic lower GI bleeding.  Minor proximal IMA dissection related to catheter and guidewire manipulation without occlusion.   Electronically Signed   By: Jerilynn Mages.  Shick M.D.   On: 12/24/2014 15:47   Ir US Guide Vasc Access Right  12/24/2014   CLINICAL DATA:  Acute lower GI bleeding by nuclear medicine exam involving the proximal descending colon.  EXAM: ULTRASOUND GUIDANCE FOR VASCULAR ACCESS  CELIAC, SMA, AND IMA CATHETERIZATIONS AND ANGIOGRAMS  Date:  8/27/20168/27/2016 2:58 pm  Radiologist:  M. Daryll Brod, MD   Guidance:  ULTRASOUND FLUOROSCOPIC  FLUOROSCOPY TIME:  9 MINUTES 36 SECONDS, 1,024 MGY  MEDICATIONS AND MEDICAL HISTORY: 1 mg Versed, 50 mcg fentanyl  ANESTHESIA/SEDATION: 30 minutes  CONTRAST:  122mL OMNIPAQUE IOHEXOL 300 MG/ML  SOLN  COMPLICATIONS: None immediate  PROCEDURE: Informed consent was obtained from the patient following explanation of the procedure, risks, benefits and alternatives. The patient understands, agrees and consents for the procedure. All questions were addressed. A time out was performed.  Maximal barrier sterile technique utilized including caps, mask, sterile gowns, sterile gloves, large sterile drape, hand hygiene, and ChloraPrep.  Under sterile conditions and local anesthesia, ultrasound micropuncture access performed of the right common femoral artery. Five French sheath inserted over a Bentson guidewire. C2 catheter advanced over guidewire and utilized initially to select the celiac artery. Selective  celiac angiogram performed.  Celiac: Celiac origin is patent. Splenic, left gastric and hepatic vasculature are patent. Gastroduodenal artery patent. No active upper GI tract bleeding demonstrated.  Catheter was retracted and utilized to select the SMA origin. Selective SMA angiogram performed.  SMA: SMA origin is patent. Main SMA trunk is patent. Jejunal and colic branches appear patent. No evidence of active bleeding.  Catheter was retracted and exchanged for a Sos Omni select catheter. This catheter was utilized to select the IMA origin.  IMA: IMA main trunk is patent. The left colic and superior hemorrhoidal branches are all patent. No evidence of active bleeding.  Multiple attempts were made to access the left colic branches with a micro catheter and micro guidewire from the IMA origin. However the catheter and guidewire would not easily advance peripherally into the IMA. Repeat injection of the IMA demonstrates a small proximal IMA dissection. This is minimally flow limiting. No  thrombus. Because of this, the procedure was stopped.  IMPRESSION: Successful celiac, SMA and IMA angiograms without evidence of active colonic lower GI bleeding.  Minor proximal IMA dissection related to catheter and guidewire manipulation without occlusion.   Electronically Signed   By: Jerilynn Mages.  Shick M.D.   On: 12/24/2014 15:47   Dg Chest Port 1 View  12/24/2014   CLINICAL DATA:  Doctor attempted central line placement on the left side. Ended up putting it in the femoral.  EXAM: PORTABLE CHEST - 1 VIEW  COMPARISON:  06/22/2011  FINDINGS: No pneumothorax following attempted central line placement.  Lungs are clear.  Heart, mediastinum and hila are unremarkable.  Old rib fractures on the left.  IMPRESSION: No acute cardiopulmonary disease.  No pneumothorax.   Electronically Signed   By: Lajean Manes M.D.   On: 12/24/2014 12:46    ASSESSMENT / PLAN:  PULMONARY OETT A: Chronic sinusitis on CT head Hx of asthma    - stable 12/25/2014   P:  Restart home advair Restart home nasal spray saline Start new nasal steroid Needs opd ENT in [place], [place] CVL A:  Hypotensive - transiet at admit due to gi bleed   - never needed pressors since admit to [facility]  P:  Hold antihypertensives still - aim to restart 12/26/14 Fluids to saline lock DC cvl  RENAL A:  Hypomagnesemia yesterday   P:  Replete mag sulfate monitopr  GASTROINTESTINAL A:  GIB - high pretest prob for diverticular - clinicallyresolved. Negative IR eval - having old blood via stool 12/25/2014   P:  PPI Miralax Carb modified diet  HEMATOLOGIC  Recent Labs  12/25/14 0230 12/25/14 0526  HGB 8.5* 8.1*           A:  Monitor for acute blood loss anemia  P:  Serial cbc - through 8/28./16 pm  PRBC for hgb  <7gm% or bp/hr instability   INFECTIOUS A:  No acute process P:    ENDOCRINE A:  No acute issue P:    NEUROLOGIC A:  Neuro intact P:   monitor    FAMILY  - Updates: wife and patient updated 12/25/2014   - Inter-disciplinary family meet or Palliative Care meeting due by: day 7       TODAY'S SUMMARY: LGI bleed likely diverticular. Rx miralax and hope for best. Move to tele. TRH primary from 12/26/14 and PCCM off. Need ENT and Gi followup in [place], [place]. AMioon message sent to Dr I Magic-Myers   Dr. Brand Males, M.D., [facility].C.P Pulmonary and Critical  Care Medicine Staff Physician [place] Pager: [phone], If no answer or between  15:00h - 7:00h: call [phone]  12/25/2014 9:02 AM

## 2014-12-26 DIAGNOSIS — D62 Acute posthemorrhagic anemia: Secondary | ICD-10-CM

## 2014-12-26 DIAGNOSIS — K5791 Diverticulosis of intestine, part unspecified, without perforation or abscess with bleeding: Principal | ICD-10-CM

## 2014-12-26 LAB — CBC WITH DIFFERENTIAL/PLATELET
BASOS ABS: 0 10*3/uL (ref 0.0–0.1)
Basophils Relative: 0 % (ref 0–1)
EOS PCT: 4 % (ref 0–5)
Eosinophils Absolute: 0.4 10*3/uL (ref 0.0–0.7)
HEMATOCRIT: 21.4 % — AB (ref 39.0–52.0)
Hemoglobin: 7.4 g/dL — ABNORMAL LOW (ref 13.0–17.0)
LYMPHS PCT: 22 % (ref 12–46)
Lymphs Abs: 2.3 10*3/uL (ref 0.7–4.0)
MCH: 30.3 pg (ref 26.0–34.0)
MCHC: 34.6 g/dL (ref 30.0–36.0)
MCV: 87.7 fL (ref 78.0–100.0)
Monocytes Absolute: 0.8 10*3/uL (ref 0.1–1.0)
Monocytes Relative: 8 % (ref 3–12)
NEUTROS ABS: 7.2 10*3/uL (ref 1.7–7.7)
Neutrophils Relative %: 66 % (ref 43–77)
PLATELETS: 146 10*3/uL — AB (ref 150–400)
RBC: 2.44 MIL/uL — AB (ref 4.22–5.81)
RDW: 14.7 % (ref 11.5–15.5)
WBC: 10.7 10*3/uL — AB (ref 4.0–10.5)

## 2014-12-26 LAB — BASIC METABOLIC PANEL
ANION GAP: 6 (ref 5–15)
BUN: 9 mg/dL (ref 6–20)
CO2: 28 mmol/L (ref 22–32)
Calcium: 7.9 mg/dL — ABNORMAL LOW (ref 8.9–10.3)
Chloride: 105 mmol/L (ref 101–111)
Creatinine, Ser: 0.78 mg/dL (ref 0.61–1.24)
GLUCOSE: 133 mg/dL — AB (ref 65–99)
POTASSIUM: 3.3 mmol/L — AB (ref 3.5–5.1)
Sodium: 139 mmol/L (ref 135–145)

## 2014-12-26 LAB — CBC
HCT: 20.5 % — ABNORMAL LOW (ref 39.0–52.0)
HCT: 21 % — ABNORMAL LOW (ref 39.0–52.0)
Hemoglobin: 7.1 g/dL — ABNORMAL LOW (ref 13.0–17.0)
Hemoglobin: 7.2 g/dL — ABNORMAL LOW (ref 13.0–17.0)
MCH: 30.5 pg (ref 26.0–34.0)
MCH: 30.3 pg (ref 26.0–34.0)
MCHC: 34.6 g/dL (ref 30.0–36.0)
MCHC: 34.3 g/dL (ref 30.0–36.0)
MCV: 88 fL (ref 78.0–100.0)
MCV: 88.2 fL (ref 78.0–100.0)
PLATELETS: 151 10*3/uL (ref 150–400)
PLATELETS: 151 10*3/uL (ref 150–400)
RBC: 2.33 MIL/uL — AB (ref 4.22–5.81)
RBC: 2.38 MIL/uL — AB (ref 4.22–5.81)
RDW: 14.7 % (ref 11.5–15.5)
RDW: 14.8 % (ref 11.5–15.5)
WBC: 9.9 10*3/uL (ref 4.0–10.5)
WBC: 10.4 10*3/uL (ref 4.0–10.5)

## 2014-12-26 LAB — ABO/RH: ABO/RH(D): O POS

## 2014-12-26 LAB — MAGNESIUM: Magnesium: 1.9 mg/dL (ref 1.7–2.4)

## 2014-12-26 LAB — PHOSPHORUS: PHOSPHORUS: 1.4 mg/dL — AB (ref 2.5–4.6)

## 2014-12-26 LAB — GLUCOSE, CAPILLARY: GLUCOSE-CAPILLARY: 191 mg/dL — AB (ref 65–99)

## 2014-12-26 LAB — PREPARE RBC (CROSSMATCH)

## 2014-12-26 MED ORDER — LISINOPRIL-HYDROCHLOROTHIAZIDE 20-25 MG PO TABS: 1.0000 | ORAL_TABLET | Freq: Every day | ORAL | Status: DC

## 2014-12-26 MED ORDER — POTASSIUM CHLORIDE CRYS ER 20 MEQ PO TBCR
40.0000 meq | EXTENDED_RELEASE_TABLET | Freq: Once | ORAL | Status: AC
  Administered 2014-12-26: 40 meq via ORAL
  Filled 2014-12-26: qty 2

## 2014-12-26 MED ORDER — HYDROCHLOROTHIAZIDE 25 MG PO TABS
25.0000 mg | ORAL_TABLET | Freq: Every day | ORAL | Status: DC
  Administered 2014-12-26: 25 mg via ORAL
  Filled 2014-12-26 (×2): qty 1

## 2014-12-26 MED ORDER — SODIUM CHLORIDE 0.9 % IV SOLN
Freq: Once | INTRAVENOUS | Status: AC
  Administered 2014-12-26: 15:00:00 via INTRAVENOUS

## 2014-12-26 MED ORDER — LISINOPRIL 20 MG PO TABS
20.0000 mg | ORAL_TABLET | Freq: Every day | ORAL | Status: DC
  Administered 2014-12-26 – 2014-12-27 (×2): 20 mg via ORAL
  Filled 2014-12-26 (×2): qty 1

## 2014-12-26 MED ORDER — TAMSULOSIN HCL 0.4 MG PO CAPS
0.4000 mg | ORAL_CAPSULE | Freq: Every day | ORAL | Status: DC
  Administered 2014-12-26 – 2014-12-27 (×2): 0.4 mg via ORAL
  Filled 2014-12-26 (×2): qty 1

## 2014-12-26 MED ORDER — POTASSIUM & SODIUM PHOSPHATES 280-160-250 MG PO PACK
1.0000 | PACK | Freq: Three times a day (TID) | ORAL | Status: DC
  Administered 2014-12-26 – 2014-12-27 (×3): 1 via ORAL
  Filled 2014-12-26 (×7): qty 1

## 2014-12-26 MED ORDER — DUTASTERIDE 0.5 MG PO CAPS
0.5000 mg | ORAL_CAPSULE | Freq: Every day | ORAL | Status: DC
  Administered 2014-12-26 – 2014-12-27 (×2): 0.5 mg via ORAL
  Filled 2014-12-26 (×2): qty 1

## 2014-12-26 NOTE — Progress Notes (Signed)
Subjective: No abdominal pain. No blood in stool x 2 days.  Objective: Vital signs in last 24 hours: Temp:  [98.4 F (36.9 C)-99.7 F (37.6 C)] 99.7 F (37.6 C) (08/29 1359) Pulse Rate:  [92-99] 97 (08/29 1359) Resp:  [16-20] 18 (08/29 1359) BP: (130-148)/(48-68) 137/48 mmHg (08/29 1359) SpO2:  [93 %-99 %] 94 % (08/29 1359) Weight:  [97.07 kg (214 lb)] 97.07 kg (214 lb) (08/28 2021) Weight change: 5.57 kg (12 lb 4.5 oz) Last BM Date: 12/26/14  PE: GEN:  A bit deconditioned-appearing but otherwise appears in no acute distress ABD:  Protuberant, soft, active bowel sounds  Lab Results: CBC    Component Value Date/Time   WBC 10.4 12/26/2014 0752   RBC 2.38* 12/26/2014 0752   HGB 7.2* 12/26/2014 0752   HCT 21.0* 12/26/2014 0752   PLT 151 12/26/2014 0752   MCV 88.2 12/26/2014 0752   MCH 30.3 12/26/2014 0752   MCHC 34.3 12/26/2014 0752   RDW 14.8 12/26/2014 0752   LYMPHSABS 2.3 12/26/2014 0359   MONOABS 0.8 12/26/2014 0359   EOSABS 0.4 12/26/2014 0359   BASOSABS 0.0 12/26/2014 0359   CMP     Component Value Date/Time   NA 139 12/26/2014 0359   K 3.3* 12/26/2014 0359   CL 105 12/26/2014 0359   CO2 28 12/26/2014 0359   GLUCOSE 133* 12/26/2014 0359   BUN 9 12/26/2014 0359   CREATININE 0.78 12/26/2014 0359   CALCIUM 7.9* 12/26/2014 0359   PROT 3.8* 12/24/2014 1515   ALBUMIN 2.2* 12/24/2014 1515   AST 25 12/24/2014 1515   ALT 26 12/24/2014 1515   ALKPHOS 30* 12/24/2014 1515   BILITOT 1.2 12/24/2014 1515   GFRNONAA >60 12/26/2014 0359   GFRAA >60 12/26/2014 0359   Studies/Results: Tagged RBC (elsewhere) + bleeding descending colon; angiogram here negative  Assessment:  1.  Hematochezia, resolved, suspect diverticular bleeding. 2.  Acute blood loss anemia, likely from #1 above.  Suspect interval decrease in hemoglobin, since patient has not had blood in stool x 2 days, is mostly likely reflective of equilibrative changes.  Plan:  1.  Continue diet. 2.   Transfusion today planned. 3.  Would try to protract interval of CBCs (every 24 hours would be acceptable to me), doubt there is much to be gained by q 4 hour CBCs since he's had no active bleeding in ~ 48 hours. 4.  Will follow.   Landry Dyke 12/26/2014, 2:06 PM   Pager [phone] If no answer or after 5 PM call [phone]

## 2014-12-26 NOTE — Progress Notes (Signed)
Late Entry  Patient with continued urinary retention and discomfort. Dr. Clementeen Graham notified who stated it was okay to place foley catheter until tomorrow.  Joellen Jersey, RN.

## 2014-12-26 NOTE — Progress Notes (Signed)
[facility] PROGRESS NOTE  Shawn Lowery UGQ:[id number] DOB: [birth year] DOA: 12/24/2014 PCP: Sallee Lange, NP  Brief narrative 69 year old male with history of hypertension, diverticulosis and diverticulitis presented to [facility] on 8/26 with 6-7 episodes of bloody stool. He had a syncopal episode on 8/27 and became hypotensive and was transferred to ICU there. A nuclear bleeding scan done there showed bleeding in the proximal descending colon. He was transferred to [facility] and a mesenteric angiogram performed with plan on embolization for active bleeding but patient had no active bleeding and angiogram was performed. He has not had further lower GI bleed or abdominal pain.. Patient admitted to ICU and transferred to hospitalist service on 8/29. [facility] following.  Assessment/Plan: Acute blood loss anemia Possibly associated with diverticular bleed. No intervention required and seems to have subsided spontaneously. [facility] following. Transfuse 1 unit PRBC today. Continue MiraLAX. -Monitor H&H in a.m. Inpatient versus outpatient colonoscopy.  hypotension  Associated with GI bleed. Now elevated. Resume home blood pressure medications.  BPH On dutaseride  and Flomax. Unable to void. Have resume. Increase urinary retention on bladder scan. Order for Foley.  Hypophosphatemia Will repeat.  Hx of  asthma Stable. Continue home Advair  Code Status: Full code Family Communication: Wife at bedside Disposition Plan: Transfuse 1 unit PRBC today. If no further bleeding could possibly be discharged home tomorrow   Consultants:  PC CM  IR  [facility]  Procedures:  Mesenteric angiogram  Antibiotics:  None  HPI/Subjective: Ace and seen and examined. Denies any abdominal pain or further rectal bleed.  Objective: Filed Vitals:   12/26/14 1359  BP: 137/48  Pulse: 97  Temp: 99.7 F (37.6 C)  Resp: 18    Intake/Output Summary (Last 24 hours) at  12/26/14 1407 Last data filed at 12/26/14 1238  Gross per 24 hour  Intake   1332 ml  Output   2301 ml  Net   -969 ml   Filed Weights   12/24/14 1520 12/25/14 0500 12/25/14 2021  Weight: 91.5 kg (201 lb 11.5 oz) 91.5 kg (201 lb 11.5 oz) 97.07 kg (214 lb)    Exam:   General:  Elderly male in no acute distress  HEENT: Pallor present, moist oral mucosa, supple neck  Chest: Clear to auscultation bilaterally  CVS: Normal S1 and S2, no murmurs rub or gallop  GI: Soft, nondistended, nontender, bowel sounds present  Musculoskeletal: Warm, no edema    Data Reviewed: Basic Metabolic Panel:  Recent Labs Lab 12/23/14 1658 12/24/14 0253 12/24/14 1515 12/25/14 0526 12/26/14 0359  NA 141 140 141 139 139  K 3.9 3.7 4.7 3.6 3.3*  CL 105 105 113* 108 105  CO2 30 29 25 27 28   GLUCOSE 117* 112* 134* 123* 133*  BUN 19 17 18 15 9   CREATININE 0.85 0.67 1.17 0.85 0.78  CALCIUM 9.4 8.7* 7.0* 7.3* 7.9*  MG  --   --  1.5*  --  1.9  PHOS  --   --  3.1  --  1.4*   Liver Function Tests:  Recent Labs Lab 12/23/14 1658 12/24/14 1515  AST 35 25  ALT 38 26  ALKPHOS 47 30*  BILITOT 0.7 1.2  PROT 6.1* 3.8*  ALBUMIN 3.7 2.2*   No results for input(s): LIPASE, AMYLASE in the last 168 hours. No results for input(s): AMMONIA in the last 168 hours. CBC:  Recent Labs Lab 12/25/14 1030 12/25/14 1926 12/26/14 0053 12/26/14 0359 12/26/14 8828  WBC 9.1 8.4 9.9 10.7* 10.4  NEUTROABS  --   --   --  7.2  --   HGB 8.0* 7.1* 7.1* 7.4* 7.2*  HCT 23.4* 20.7* 20.5* 21.4* 21.0*  MCV 88.3 88.1 88.0 87.7 88.2  PLT 155 142* 151 146* 151   Cardiac Enzymes:  Recent Labs Lab 12/24/14 1515 12/24/14 2330 12/25/14 1033  TROPONINI <0.03 <0.03 <0.03   BNP (last 3 results) No results for input(s): BNP in the last 8760 hours.  ProBNP (last 3 results) No results for input(s): PROBNP in the last 8760 hours.  CBG:  Recent Labs Lab 12/24/14 1553  GLUCAP 121*    Recent Results (from the  past 240 hour(s))  MRSA PCR Screening     Status: None   Collection Time: 12/24/14  8:36 AM  Result Value Ref Range Status   MRSA by PCR NEGATIVE NEGATIVE Final    Comment:        The GeneXpert MRSA Assay (FDA approved for NASAL specimens only), is one component of a comprehensive MRSA colonization surveillance program. It is not intended to diagnose MRSA infection nor to guide or monitor treatment for MRSA infections.      Studies: Ir Angiogram Visceral Selective  12/24/2014   CLINICAL DATA:  Acute lower GI bleeding by nuclear medicine exam involving the proximal descending colon.  EXAM: ULTRASOUND GUIDANCE FOR VASCULAR ACCESS  CELIAC, SMA, AND IMA CATHETERIZATIONS AND ANGIOGRAMS  Date:  8/27/20168/27/2016 2:58 pm  Radiologist:  M. Daryll Brod, MD  Guidance:  ULTRASOUND FLUOROSCOPIC  FLUOROSCOPY TIME:  9 MINUTES 36 SECONDS, 1,024 MGY  MEDICATIONS AND MEDICAL HISTORY: 1 mg Versed, 50 mcg fentanyl  ANESTHESIA/SEDATION: 30 minutes  CONTRAST:  131mL OMNIPAQUE IOHEXOL 300 MG/ML  SOLN  COMPLICATIONS: None immediate  PROCEDURE: Informed consent was obtained from the patient following explanation of the procedure, risks, benefits and alternatives. The patient understands, agrees and consents for the procedure. All questions were addressed. A time out was performed.  Maximal barrier sterile technique utilized including caps, mask, sterile gowns, sterile gloves, large sterile drape, hand hygiene, and ChloraPrep.  Under sterile conditions and local anesthesia, ultrasound micropuncture access performed of the right common femoral artery. Five French sheath inserted over a Bentson guidewire. C2 catheter advanced over guidewire and utilized initially to select the celiac artery. Selective celiac angiogram performed.  Celiac: Celiac origin is patent. Splenic, left gastric and hepatic vasculature are patent. Gastroduodenal artery patent. No active upper GI tract bleeding demonstrated.  Catheter was retracted  and utilized to select the SMA origin. Selective SMA angiogram performed.  SMA: SMA origin is patent. Main SMA trunk is patent. Jejunal and colic branches appear patent. No evidence of active bleeding.  Catheter was retracted and exchanged for a Sos Omni select catheter. This catheter was utilized to select the IMA origin.  IMA: IMA main trunk is patent. The left colic and superior hemorrhoidal branches are all patent. No evidence of active bleeding.  Multiple attempts were made to access the left colic branches with a micro catheter and micro guidewire from the IMA origin. However the catheter and guidewire would not easily advance peripherally into the IMA. Repeat injection of the IMA demonstrates a small proximal IMA dissection. This is minimally flow limiting. No thrombus. Because of this, the procedure was stopped.  IMPRESSION: Successful celiac, SMA and IMA angiograms without evidence of active colonic lower GI bleeding.  Minor proximal IMA dissection related to catheter and guidewire manipulation without occlusion.   Electronically Signed  By: Eugenie Filler M.D.   On: 12/24/2014 15:47   Ir Angiogram Visceral Selective  12/24/2014   CLINICAL DATA:  Acute lower GI bleeding by nuclear medicine exam involving the proximal descending colon.  EXAM: ULTRASOUND GUIDANCE FOR VASCULAR ACCESS  CELIAC, SMA, AND IMA CATHETERIZATIONS AND ANGIOGRAMS  Date:  8/27/20168/27/2016 2:58 pm  Radiologist:  M. Daryll Brod, MD  Guidance:  ULTRASOUND FLUOROSCOPIC  FLUOROSCOPY TIME:  9 MINUTES 36 SECONDS, 1,024 MGY  MEDICATIONS AND MEDICAL HISTORY: 1 mg Versed, 50 mcg fentanyl  ANESTHESIA/SEDATION: 30 minutes  CONTRAST:  158mL OMNIPAQUE IOHEXOL 300 MG/ML  SOLN  COMPLICATIONS: None immediate  PROCEDURE: Informed consent was obtained from the patient following explanation of the procedure, risks, benefits and alternatives. The patient understands, agrees and consents for the procedure. All questions were addressed. A time out was  performed.  Maximal barrier sterile technique utilized including caps, mask, sterile gowns, sterile gloves, large sterile drape, hand hygiene, and ChloraPrep.  Under sterile conditions and local anesthesia, ultrasound micropuncture access performed of the right common femoral artery. Five French sheath inserted over a Bentson guidewire. C2 catheter advanced over guidewire and utilized initially to select the celiac artery. Selective celiac angiogram performed.  Celiac: Celiac origin is patent. Splenic, left gastric and hepatic vasculature are patent. Gastroduodenal artery patent. No active upper GI tract bleeding demonstrated.  Catheter was retracted and utilized to select the SMA origin. Selective SMA angiogram performed.  SMA: SMA origin is patent. Main SMA trunk is patent. Jejunal and colic branches appear patent. No evidence of active bleeding.  Catheter was retracted and exchanged for a Sos Omni select catheter. This catheter was utilized to select the IMA origin.  IMA: IMA main trunk is patent. The left colic and superior hemorrhoidal branches are all patent. No evidence of active bleeding.  Multiple attempts were made to access the left colic branches with a micro catheter and micro guidewire from the IMA origin. However the catheter and guidewire would not easily advance peripherally into the IMA. Repeat injection of the IMA demonstrates a small proximal IMA dissection. This is minimally flow limiting. No thrombus. Because of this, the procedure was stopped.  IMPRESSION: Successful celiac, SMA and IMA angiograms without evidence of active colonic lower GI bleeding.  Minor proximal IMA dissection related to catheter and guidewire manipulation without occlusion.   Electronically Signed   By: Jerilynn Mages.  Shick M.D.   On: 12/24/2014 15:47   Ir Angiogram Visceral Selective  12/24/2014   CLINICAL DATA:  Acute lower GI bleeding by nuclear medicine exam involving the proximal descending colon.  EXAM: ULTRASOUND GUIDANCE  FOR VASCULAR ACCESS  CELIAC, SMA, AND IMA CATHETERIZATIONS AND ANGIOGRAMS  Date:  8/27/20168/27/2016 2:58 pm  Radiologist:  M. Daryll Brod, MD  Guidance:  ULTRASOUND FLUOROSCOPIC  FLUOROSCOPY TIME:  9 MINUTES 36 SECONDS, 1,024 MGY  MEDICATIONS AND MEDICAL HISTORY: 1 mg Versed, 50 mcg fentanyl  ANESTHESIA/SEDATION: 30 minutes  CONTRAST:  152mL OMNIPAQUE IOHEXOL 300 MG/ML  SOLN  COMPLICATIONS: None immediate  PROCEDURE: Informed consent was obtained from the patient following explanation of the procedure, risks, benefits and alternatives. The patient understands, agrees and consents for the procedure. All questions were addressed. A time out was performed.  Maximal barrier sterile technique utilized including caps, mask, sterile gowns, sterile gloves, large sterile drape, hand hygiene, and ChloraPrep.  Under sterile conditions and local anesthesia, ultrasound micropuncture access performed of the right common femoral artery. Five French sheath inserted over a Bentson guidewire. C2 catheter advanced over  guidewire and utilized initially to select the celiac artery. Selective celiac angiogram performed.  Celiac: Celiac origin is patent. Splenic, left gastric and hepatic vasculature are patent. Gastroduodenal artery patent. No active upper GI tract bleeding demonstrated.  Catheter was retracted and utilized to select the SMA origin. Selective SMA angiogram performed.  SMA: SMA origin is patent. Main SMA trunk is patent. Jejunal and colic branches appear patent. No evidence of active bleeding.  Catheter was retracted and exchanged for a Sos Omni select catheter. This catheter was utilized to select the IMA origin.  IMA: IMA main trunk is patent. The left colic and superior hemorrhoidal branches are all patent. No evidence of active bleeding.  Multiple attempts were made to access the left colic branches with a micro catheter and micro guidewire from the IMA origin. However the catheter and guidewire would not easily  advance peripherally into the IMA. Repeat injection of the IMA demonstrates a small proximal IMA dissection. This is minimally flow limiting. No thrombus. Because of this, the procedure was stopped.  IMPRESSION: Successful celiac, SMA and IMA angiograms without evidence of active colonic lower GI bleeding.  Minor proximal IMA dissection related to catheter and guidewire manipulation without occlusion.   Electronically Signed   By: Jerilynn Mages.  Shick M.D.   On: 12/24/2014 15:47   Ir US Guide Vasc Access Right  12/24/2014   CLINICAL DATA:  Acute lower GI bleeding by nuclear medicine exam involving the proximal descending colon.  EXAM: ULTRASOUND GUIDANCE FOR VASCULAR ACCESS  CELIAC, SMA, AND IMA CATHETERIZATIONS AND ANGIOGRAMS  Date:  8/27/20168/27/2016 2:58 pm  Radiologist:  M. Daryll Brod, MD  Guidance:  ULTRASOUND FLUOROSCOPIC  FLUOROSCOPY TIME:  9 MINUTES 36 SECONDS, 1,024 MGY  MEDICATIONS AND MEDICAL HISTORY: 1 mg Versed, 50 mcg fentanyl  ANESTHESIA/SEDATION: 30 minutes  CONTRAST:  169mL OMNIPAQUE IOHEXOL 300 MG/ML  SOLN  COMPLICATIONS: None immediate  PROCEDURE: Informed consent was obtained from the patient following explanation of the procedure, risks, benefits and alternatives. The patient understands, agrees and consents for the procedure. All questions were addressed. A time out was performed.  Maximal barrier sterile technique utilized including caps, mask, sterile gowns, sterile gloves, large sterile drape, hand hygiene, and ChloraPrep.  Under sterile conditions and local anesthesia, ultrasound micropuncture access performed of the right common femoral artery. Five French sheath inserted over a Bentson guidewire. C2 catheter advanced over guidewire and utilized initially to select the celiac artery. Selective celiac angiogram performed.  Celiac: Celiac origin is patent. Splenic, left gastric and hepatic vasculature are patent. Gastroduodenal artery patent. No active upper GI tract bleeding demonstrated.   Catheter was retracted and utilized to select the SMA origin. Selective SMA angiogram performed.  SMA: SMA origin is patent. Main SMA trunk is patent. Jejunal and colic branches appear patent. No evidence of active bleeding.  Catheter was retracted and exchanged for a Sos Omni select catheter. This catheter was utilized to select the IMA origin.  IMA: IMA main trunk is patent. The left colic and superior hemorrhoidal branches are all patent. No evidence of active bleeding.  Multiple attempts were made to access the left colic branches with a micro catheter and micro guidewire from the IMA origin. However the catheter and guidewire would not easily advance peripherally into the IMA. Repeat injection of the IMA demonstrates a small proximal IMA dissection. This is minimally flow limiting. No thrombus. Because of this, the procedure was stopped.  IMPRESSION: Successful celiac, SMA and IMA angiograms without evidence of active colonic lower  GI bleeding.  Minor proximal IMA dissection related to catheter and guidewire manipulation without occlusion.   Electronically Signed   By: Jerilynn Mages.  Shick M.D.   On: 12/24/2014 15:47    Scheduled Meds: . sodium chloride   Intravenous Once  . dutasteride  0.5 mg Oral Daily  . fluticasone  2 spray Each Nare Daily  . hydrochlorothiazide  25 mg Oral Daily  . Influenza vac split quadrivalent PF  0.5 mL Intramuscular Tomorrow-1000  . lisinopril  20 mg Oral Daily  . mometasone-formoterol  2 puff Inhalation BID  . multivitamin with minerals  1 tablet Oral Q M,W,F  . pantoprazole  40 mg Oral Q1200  . pneumococcal 23 valent vaccine  0.5 mL Intramuscular Tomorrow-1000  . polyethylene glycol  17 g Oral TID  . pravastatin  10 mg Oral q1800  . tamsulosin  0.4 mg Oral Daily  . vitamin C  1,000 mg Oral Daily   Continuous Infusions: . sodium chloride Stopped (12/26/14 1015)       Time spent: [place] Pager [phone] If 7PM-7AM,  please contact night-coverage at [email or web address], password [facility] 12/26/2014, 2:07 PM  LOS: 2 days

## 2014-12-27 DIAGNOSIS — I959 Hypotension, unspecified: Secondary | ICD-10-CM | POA: Diagnosis present

## 2014-12-27 DIAGNOSIS — K5731 Diverticulosis of large intestine without perforation or abscess with bleeding: Secondary | ICD-10-CM | POA: Diagnosis present

## 2014-12-27 DIAGNOSIS — N401 Enlarged prostate with lower urinary tract symptoms: Secondary | ICD-10-CM | POA: Diagnosis present

## 2014-12-27 DIAGNOSIS — K922 Gastrointestinal hemorrhage, unspecified: Secondary | ICD-10-CM

## 2014-12-27 DIAGNOSIS — I10 Essential (primary) hypertension: Secondary | ICD-10-CM | POA: Diagnosis not present

## 2014-12-27 DIAGNOSIS — R338 Other retention of urine: Secondary | ICD-10-CM

## 2014-12-27 DIAGNOSIS — N400 Enlarged prostate without lower urinary tract symptoms: Secondary | ICD-10-CM

## 2014-12-27 LAB — CBC WITH DIFFERENTIAL/PLATELET
BASOS PCT: 0 % (ref 0–1)
Basophils Absolute: 0 10*3/uL (ref 0.0–0.1)
EOS ABS: 0.1 10*3/uL (ref 0.0–0.7)
Eosinophils Relative: 1 % (ref 0–5)
HCT: 22.7 % — ABNORMAL LOW (ref 39.0–52.0)
HEMOGLOBIN: 7.7 g/dL — AB (ref 13.0–17.0)
Lymphocytes Relative: 12 % (ref 12–46)
Lymphs Abs: 1.2 10*3/uL (ref 0.7–4.0)
MCH: 30.4 pg (ref 26.0–34.0)
MCHC: 33.9 g/dL (ref 30.0–36.0)
MCV: 89.7 fL (ref 78.0–100.0)
Monocytes Absolute: 0.7 10*3/uL (ref 0.1–1.0)
Monocytes Relative: 7 % (ref 3–12)
NEUTROS PCT: 80 % — AB (ref 43–77)
Neutro Abs: 8 10*3/uL — ABNORMAL HIGH (ref 1.7–7.7)
Platelets: 157 10*3/uL (ref 150–400)
RBC: 2.53 MIL/uL — AB (ref 4.22–5.81)
RDW: 15.1 % (ref 11.5–15.5)
WBC: 10 10*3/uL (ref 4.0–10.5)

## 2014-12-27 LAB — TYPE AND SCREEN
ABO/RH(D): O POS
ANTIBODY SCREEN: NEGATIVE
Unit division: 0

## 2014-12-27 MED ORDER — CEPHALEXIN 500 MG PO CAPS: 500.0000 mg | ORAL_CAPSULE | Freq: Two times a day (BID) | ORAL | Status: AC

## 2014-12-27 MED ORDER — POLYETHYLENE GLYCOL 3350 17 G PO PACK: 17.0000 g | PACK | Freq: Three times a day (TID) | ORAL | Status: DC

## 2014-12-27 MED ORDER — TAMSULOSIN HCL 0.4 MG PO CAPS: 0.4000 mg | ORAL_CAPSULE | Freq: Every day | ORAL | Status: DC

## 2014-12-27 NOTE — Discharge Instructions (Signed)
Rectal Bleeding °Rectal bleeding is when blood passes out of the anus. It is usually a sign that something is wrong. It may not be serious, but it should always be evaluated. Rectal bleeding may present as bright red blood or extremely dark stools. The color may range from dark red or maroon to black (like tar). It is important that the cause of rectal bleeding be identified so treatment can be started and the problem corrected. °CAUSES  °· Hemorrhoids. These are enlarged (dilated) blood vessels or veins in the anal or rectal area. °· Fistulas. These are abnormal, burrowing channels that usually run from inside the rectum to the skin around the anus. They can bleed. °· Anal fissures. This is a tear in the tissue of the anus. Bleeding occurs with bowel movements. °· Diverticulosis. This is a condition in which pockets or sacs project from the bowel wall. Occasionally, the sacs can bleed. °· Diverticulitis. This is an infection involving diverticulosis of the colon. °· Proctitis and colitis. These are conditions in which the rectum, colon, or both, can become inflamed and pitted (ulcerated). °· Polyps and cancer. Polyps are non-cancerous (benign) growths in the colon that may bleed. Certain types of polyps turn into cancer. °· Protrusion of the rectum. Part of the rectum can project from the anus and bleed. °· Certain medicines. °· Intestinal infections. °· Blood vessel abnormalities. °HOME CARE INSTRUCTIONS °· Eat a high-fiber diet to keep your stool soft. °· Limit activity. °· Drink enough fluids to keep your urine clear or pale yellow. °· Warm baths may be useful to soothe rectal pain. °· Follow up with your caregiver as directed. °SEEK IMMEDIATE MEDICAL CARE IF: °· You develop increased bleeding. °· You have black or dark red stools. °· You vomit blood or material that looks like coffee grounds. °· You have abdominal pain or tenderness. °· You have a fever. °· You feel weak, nauseous, or you faint. °· You have  severe rectal pain or you are unable to have a bowel movement. °MAKE SURE YOU: °· Understand these instructions. °· Will watch your condition. °· Will get help right away if you are not doing well or get worse. °Document Released: 10/05/2001 Document Revised: 07/08/2011 Document Reviewed: 09/30/2010 °Exit[employer]® Patient Information ©2015 [employer]. This information is not intended to replace advice given to you by your health care provider. Make sure you discuss any questions you have with your health care provider. ° °

## 2014-12-27 NOTE — Progress Notes (Signed)
Subjective: Blood in urine (foley bag). No blood in stool. No abdominal pain. Tolerating diet.  Objective: Vital signs in last 24 hours: Temp:  [98.2 F (36.8 C)-99.7 F (37.6 C)] 98.4 F (36.9 C) (08/30 0528) Pulse Rate:  [84-97] 84 (08/30 0528) Resp:  [16-18] 16 (08/30 0528) BP: (105-137)/(42-70) 110/70 mmHg (08/30 0528) SpO2:  [94 %-98 %] 98 % (08/30 0528) Weight:  [98 kg (216 lb 0.8 oz)] 98 kg (216 lb 0.8 oz) (08/29 2114) Weight change: 0.93 kg (2 lb 0.8 oz) Last BM Date: 12/26/14  PE: GEN:  NAD ABD:  Protuberant, soft, non-tender  Lab Results: CBC    Component Value Date/Time   WBC 10.0 12/27/2014 0713   RBC 2.53* 12/27/2014 0713   HGB 7.7* 12/27/2014 0713   HCT 22.7* 12/27/2014 0713   PLT 157 12/27/2014 0713   MCV 89.7 12/27/2014 0713   MCH 30.4 12/27/2014 0713   MCHC 33.9 12/27/2014 0713   RDW 15.1 12/27/2014 0713   LYMPHSABS 1.2 12/27/2014 0713   MONOABS 0.7 12/27/2014 0713   EOSABS 0.1 12/27/2014 0713   BASOSABS 0.0 12/27/2014 0713   CMP     Component Value Date/Time   NA 139 12/26/2014 0359   K 3.3* 12/26/2014 0359   CL 105 12/26/2014 0359   CO2 28 12/26/2014 0359   GLUCOSE 133* 12/26/2014 0359   BUN 9 12/26/2014 0359   CREATININE 0.78 12/26/2014 0359   CALCIUM 7.9* 12/26/2014 0359   PROT 3.8* 12/24/2014 1515   ALBUMIN 2.2* 12/24/2014 1515   AST 25 12/24/2014 1515   ALT 26 12/24/2014 1515   ALKPHOS 30* 12/24/2014 1515   BILITOT 1.2 12/24/2014 1515   GFRNONAA >60 12/26/2014 0359   GFRAA >60 12/26/2014 0359   Assessment:  1. Hematochezia, resolved, suspect diverticular bleeding. 2. Acute blood loss anemia, likely from #1 above. Unclear if any potential urinary source.  Suspect interval decrease in hemoglobin, since patient has not had blood in stool x 2 days, is mostly likely reflective of equilibrative changes. 3.  History enlarged prostate, multiple prior attempts at foley placement, blood in foley bag.  Plan:  1.  No further plans  from GI perspective as inpatient, unless/until patient has recurrent overt hematochezia. 2.  Will discuss blood in foley with hospitalist team, unclear if patient still needs foley and/or if urologic evaluation is needed; no gross blood seen in foley upon my evaluation today. 3.  Will sign-off; please call with questions; thank you for the consultation.   Landry Dyke 12/27/2014, 10:22 AM   Pager [phone] If no answer or after 5 PM call [phone]

## 2014-12-27 NOTE — Progress Notes (Signed)
Patient with c/o discomfort with foley ?placement   bladderscan  Usage but only 40cc noted.Urine with pink tinge  Color from insertion,otherwise draining well. After repositioning self  And ambulating feels much better.

## 2014-12-27 NOTE — Discharge Summary (Addendum)
Physician Discharge Summary  Shawn Lowery VEH:[id number] DOB: 1945/12/21 DOA: 12/24/2014  PCP: Sallee Lange, NP  Admit date: 12/24/2014 Discharge date: 12/27/2014  Time spent: 35 minutes  Recommendations for Outpatient Follow-up:   Discharge home with outpatient follow-up with PCP within one week. Needs hemoglobin checked during outpatient follow-up.  Discharge Diagnoses:  Brief narrative   Acute lower GI bleeding   Active Problems:   Hypomagnesemia   Hypotension   Essential hypertension   BPH (benign prostatic hypertrophy) with urinary retention   Diverticular hemorrhage   Discharge Condition: fair  Diet recommendation: regular  Filed Weights   12/25/14 0500 12/25/14 2021 12/26/14 2114  Weight: 91.5 kg (201 lb 11.5 oz) 97.07 kg (214 lb) 98 kg (216 lb 0.8 oz)    History of present illness:  Please refer to admission H&P in detail, in brief,  69 year old male with history of hypertension, diverticulosis and diverticulitis presented to [place] on 8/26 with 6-7 episodes of bloody stool. He had a syncopal episode on 8/27 and became hypotensive and was transferred to ICU there. A nuclear bleeding scan done there showed bleeding in the proximal descending colon. He was transferred to [facility] and a mesenteric angiogram performed with plan on embolization for active bleeding but patient had no active bleeding and angiogram was performed. He has not had further lower GI bleed or abdominal pain.. Patient admitted to ICU and transferred to hospitalist service on 8/29. [facility] following.  Hospital Course:  Acute blood loss anemia Possibly associated with diverticular bleed. No intervention required and seems to have subsided spontaneously. [facility] following. Transfuse 1 unit PRBC on 8/29. Continue MiraLAX. -Hemoglobin of 7.7 today. No further bleeding.  -Plans to follow-up with GI in [place].   hypotension  Associated with GI bleed. Now elevated.  Resume home blood pressure medications.  BPH Patient had urinary retention and a Foley was placed. Reports being on dutasteride and Flomax in the past (has not been using it for a while). I will give him a prescription for Flomax. Foley discontinued prior to discharge.   Hypophosphatemia Repleted.  Hx of asthma Stable. Continue home Advair  Code Status: Full code Family Communication: Wife at bedside Disposition Plan: Discharge home with outpatient follow-up.   Consultants:  PC CM  IR  [facility]  Procedures:  Mesenteric angiogram  Antibiotics:  None  Discharge Exam: Filed Vitals:   12/27/14 0528  BP: 110/70  Pulse: 84  Temp: 98.4 F (36.9 C)  Resp: 16     General: Elderly male in no acute distress  HEENT: Pallor present, moist oral mucosa, supple neck  Chest: Clear to auscultation bilaterally  CVS: Normal S1 and S2, no murmurs rub or gallop  GI: Soft, nondistended, nontender, bowel sounds present  Musculoskeletal: Warm, no edema  CNS: Alert and oriented  Discharge Instructions    Current Discharge Medication List    START taking these medications   Details  cephALEXin (KEFLEX) 500 MG capsule Take 1 capsule (500 mg total) by mouth 2 (two) times daily. Qty: 6 capsule, Refills: 0    polyethylene glycol (MIRALAX / GLYCOLAX) packet Take 17 g by mouth 3 (three) times daily. Qty: 14 each, Refills: 0  Tab Flomax 0.4 mg tablet                      take 1 tablet by mouth daily with supper  Qty: 30 each, no refills.    CONTINUE these medications which have NOT CHANGED   Details  Ascorbic Acid (VITAMIN C) 1000 MG tablet Take 1,000 mg by mouth daily.    Fluticasone-Salmeterol (ADVAIR) 100-50 MCG/DOSE AEPB Inhale 1 puff into the lungs 2 (two) times daily as needed (for wheezing or shortness of breath).     lisinopril-hydrochlorothiazide (PRINZIDE,ZESTORETIC) 20-25 MG per tablet Take 1  tablet by mouth daily.    lovastatin (MEVACOR) 20 MG tablet Take 20 mg by mouth daily.     Multiple Vitamin (MULTIVITAMIN WITH MINERALS) TABS tablet Take 1 tablet by mouth every Monday, Wednesday, and Friday.    sodium chloride (OCEAN) 0.65 % SOLN nasal spray Place 1 spray into both nostrils as needed for congestion.      STOP taking these medications     naproxen (NAPROSYN) 500 MG tablet        No Known Allergies Follow-up Information    Follow up with Sallee Lange, NP. Schedule an appointment as soon as possible for a visit in 1 week.   Specialty:  Internal Medicine   Contact information:   [place] [address] [phone]        The results of significant diagnostics from this hospitalization (including imaging, microbiology, ancillary and laboratory) are listed below for reference.    Significant Diagnostic Studies: Ct Head Wo Contrast  12/24/2014   CLINICAL DATA:  Admitted for acute lower GI bleed, now with syncopal episode.  EXAM: CT HEAD WITHOUT CONTRAST  TECHNIQUE: Contiguous axial images were obtained from the base of the skull through the vertex without intravenous contrast.  COMPARISON:  None.  FINDINGS: Regional soft tissues appear normal. No radiopaque foreign body. No definite displaced calvarial fracture.  There is mild atrophy with mild diffuse sulcal prominence. Gray-white differentiation is maintained. No CT evidence of acute large territory infarct. No intraparenchymal or extra-axial mass or hemorrhage. Normal size and configuration of the ventricles and basilar cisterns. No midline shift.  There is mild mucosal thickening involving the bilateral frontal sinuses as well as the anterior and posterior ethmoidal air cells. Polypoid mucosal thickening of the left maxillary sinus. No air-fluid levels. Mastoid air cells are normally aerated.  IMPRESSION: 1. Mild atrophy without acute intracranial process. 2. Sinus disease as above.  No air-fluid  levels.   Electronically Signed   By: Sandi Mariscal M.D.   On: 12/24/2014 09:40   Nm Gi Blood Loss  12/24/2014   CLINICAL DATA:  Active GI bleeding for 2 days, history diverticulitis, syncopal episodes, hypotensive, history hypertension  EXAM: NUCLEAR MEDICINE GASTROINTESTINAL BLEEDING SCAN  TECHNIQUE: Sequential abdominal images were obtained following intravenous administration of Tc-15m labeled red blood cells.  RADIOPHARMACEUTICALS:  23.3 mCi Tc-49m in-vitro labeled autologous red cells.  COMPARISON:  None; correlation made with CT abdomen 08/15/2009  FINDINGS: Normal initial blood pool distribution of tracer.  Within the first 10 minutes, abnormal GI tracer localization is seen in the lateral LEFT abdomen.  This tracer descends along the expected course of the descending colon into sigmoid colon.  This most likely represents a site of active GI bleeding at the proximal descending colon.  Later images demonstrate localization of a moderate amount of tracer within a loop in the lateral LEFT abdomen, suspect retrograde tracer passage into a redundant distal transverse colon.  IMPRESSION: Positive GI bleeding exam for a site of active GI bleeding at the proximal descending colon.  Findings called to IXL in ICU on  12/24/2014 at 1120 hours.   Electronically Signed   By: Lavonia Dana M.D.   On: 12/24/2014 11:21   Ir Angiogram Visceral Selective  12/24/2014   CLINICAL DATA:  Acute lower GI bleeding by nuclear medicine exam involving the proximal descending colon.  EXAM: ULTRASOUND GUIDANCE FOR VASCULAR ACCESS  CELIAC, SMA, AND IMA CATHETERIZATIONS AND ANGIOGRAMS  Date:  8/27/20168/27/2016 2:58 pm  Radiologist:  M. Daryll Brod, MD  Guidance:  ULTRASOUND FLUOROSCOPIC  FLUOROSCOPY TIME:  9 MINUTES 36 SECONDS, 1,024 MGY  MEDICATIONS AND MEDICAL HISTORY: 1 mg Versed, 50 mcg fentanyl  ANESTHESIA/SEDATION: 30 minutes  CONTRAST:  177mL OMNIPAQUE IOHEXOL 300 MG/ML  SOLN  COMPLICATIONS: None immediate  PROCEDURE:  Informed consent was obtained from the patient following explanation of the procedure, risks, benefits and alternatives. The patient understands, agrees and consents for the procedure. All questions were addressed. A time out was performed.  Maximal barrier sterile technique utilized including caps, mask, sterile gowns, sterile gloves, large sterile drape, hand hygiene, and ChloraPrep.  Under sterile conditions and local anesthesia, ultrasound micropuncture access performed of the right common femoral artery. Five French sheath inserted over a Bentson guidewire. C2 catheter advanced over guidewire and utilized initially to select the celiac artery. Selective celiac angiogram performed.  Celiac: Celiac origin is patent. Splenic, left gastric and hepatic vasculature are patent. Gastroduodenal artery patent. No active upper GI tract bleeding demonstrated.  Catheter was retracted and utilized to select the SMA origin. Selective SMA angiogram performed.  SMA: SMA origin is patent. Main SMA trunk is patent. Jejunal and colic branches appear patent. No evidence of active bleeding.  Catheter was retracted and exchanged for a Sos Omni select catheter. This catheter was utilized to select the IMA origin.  IMA: IMA main trunk is patent. The left colic and superior hemorrhoidal branches are all patent. No evidence of active bleeding.  Multiple attempts were made to access the left colic branches with a micro catheter and micro guidewire from the IMA origin. However the catheter and guidewire would not easily advance peripherally into the IMA. Repeat injection of the IMA demonstrates a small proximal IMA dissection. This is minimally flow limiting. No thrombus. Because of this, the procedure was stopped.  IMPRESSION: Successful celiac, SMA and IMA angiograms without evidence of active colonic lower GI bleeding.  Minor proximal IMA dissection related to catheter and guidewire manipulation without occlusion.   Electronically  Signed   By: Jerilynn Mages.  Shick M.D.   On: 12/24/2014 15:47   Ir Angiogram Visceral Selective  12/24/2014   CLINICAL DATA:  Acute lower GI bleeding by nuclear medicine exam involving the proximal descending colon.  EXAM: ULTRASOUND GUIDANCE FOR VASCULAR ACCESS  CELIAC, SMA, AND IMA CATHETERIZATIONS AND ANGIOGRAMS  Date:  8/27/20168/27/2016 2:58 pm  Radiologist:  M. Daryll Brod, MD  Guidance:  ULTRASOUND FLUOROSCOPIC  FLUOROSCOPY TIME:  9 MINUTES 36 SECONDS, 1,024 MGY  MEDICATIONS AND MEDICAL HISTORY: 1 mg Versed, 50 mcg fentanyl  ANESTHESIA/SEDATION: 30 minutes  CONTRAST:  143mL OMNIPAQUE IOHEXOL 300 MG/ML  SOLN  COMPLICATIONS: None immediate  PROCEDURE: Informed consent was obtained from the patient following explanation of the procedure, risks, benefits and alternatives. The patient understands, agrees and consents for the procedure. All questions were addressed. A time out was performed.  Maximal barrier sterile technique utilized including caps, mask, sterile gowns, sterile gloves, large sterile drape, hand hygiene, and ChloraPrep.  Under sterile conditions and local anesthesia, ultrasound micropuncture access performed of the right common femoral artery. Five [place]  sheath inserted over a Bentson guidewire. C2 catheter advanced over guidewire and utilized initially to select the celiac artery. Selective celiac angiogram performed.  Celiac: Celiac origin is patent. Splenic, left gastric and hepatic vasculature are patent. Gastroduodenal artery patent. No active upper GI tract bleeding demonstrated.  Catheter was retracted and utilized to select the SMA origin. Selective SMA angiogram performed.  SMA: SMA origin is patent. Main SMA trunk is patent. Jejunal and colic branches appear patent. No evidence of active bleeding.  Catheter was retracted and exchanged for a Sos Omni select catheter. This catheter was utilized to select the IMA origin.  IMA: IMA main trunk is patent. The left colic and superior hemorrhoidal  branches are all patent. No evidence of active bleeding.  Multiple attempts were made to access the left colic branches with a micro catheter and micro guidewire from the IMA origin. However the catheter and guidewire would not easily advance peripherally into the IMA. Repeat injection of the IMA demonstrates a small proximal IMA dissection. This is minimally flow limiting. No thrombus. Because of this, the procedure was stopped.  IMPRESSION: Successful celiac, SMA and IMA angiograms without evidence of active colonic lower GI bleeding.  Minor proximal IMA dissection related to catheter and guidewire manipulation without occlusion.   Electronically Signed   By: Jerilynn Mages.  Shick M.D.   On: 12/24/2014 15:47   Ir Angiogram Visceral Selective  12/24/2014   CLINICAL DATA:  Acute lower GI bleeding by nuclear medicine exam involving the proximal descending colon.  EXAM: ULTRASOUND GUIDANCE FOR VASCULAR ACCESS  CELIAC, SMA, AND IMA CATHETERIZATIONS AND ANGIOGRAMS  Date:  8/27/20168/27/2016 2:58 pm  Radiologist:  M. Daryll Brod, MD  Guidance:  ULTRASOUND FLUOROSCOPIC  FLUOROSCOPY TIME:  9 MINUTES 36 SECONDS, 1,024 MGY  MEDICATIONS AND MEDICAL HISTORY: 1 mg Versed, 50 mcg fentanyl  ANESTHESIA/SEDATION: 30 minutes  CONTRAST:  140mL OMNIPAQUE IOHEXOL 300 MG/ML  SOLN  COMPLICATIONS: None immediate  PROCEDURE: Informed consent was obtained from the patient following explanation of the procedure, risks, benefits and alternatives. The patient understands, agrees and consents for the procedure. All questions were addressed. A time out was performed.  Maximal barrier sterile technique utilized including caps, mask, sterile gowns, sterile gloves, large sterile drape, hand hygiene, and ChloraPrep.  Under sterile conditions and local anesthesia, ultrasound micropuncture access performed of the right common femoral artery. Five French sheath inserted over a Bentson guidewire. C2 catheter advanced over guidewire and utilized initially to  select the celiac artery. Selective celiac angiogram performed.  Celiac: Celiac origin is patent. Splenic, left gastric and hepatic vasculature are patent. Gastroduodenal artery patent. No active upper GI tract bleeding demonstrated.  Catheter was retracted and utilized to select the SMA origin. Selective SMA angiogram performed.  SMA: SMA origin is patent. Main SMA trunk is patent. Jejunal and colic branches appear patent. No evidence of active bleeding.  Catheter was retracted and exchanged for a Sos Omni select catheter. This catheter was utilized to select the IMA origin.  IMA: IMA main trunk is patent. The left colic and superior hemorrhoidal branches are all patent. No evidence of active bleeding.  Multiple attempts were made to access the left colic branches with a micro catheter and micro guidewire from the IMA origin. However the catheter and guidewire would not easily advance peripherally into the IMA. Repeat injection of the IMA demonstrates a small proximal IMA dissection. This is minimally flow limiting. No thrombus. Because of this, the procedure was stopped.  IMPRESSION: Successful celiac, SMA and  IMA angiograms without evidence of active colonic lower GI bleeding.  Minor proximal IMA dissection related to catheter and guidewire manipulation without occlusion.   Electronically Signed   By: Jerilynn Mages.  Shick M.D.   On: 12/24/2014 15:47   Ir US Guide Vasc Access Right  12/24/2014   CLINICAL DATA:  Acute lower GI bleeding by nuclear medicine exam involving the proximal descending colon.  EXAM: ULTRASOUND GUIDANCE FOR VASCULAR ACCESS  CELIAC, SMA, AND IMA CATHETERIZATIONS AND ANGIOGRAMS  Date:  8/27/20168/27/2016 2:58 pm  Radiologist:  M. Daryll Brod, MD  Guidance:  ULTRASOUND FLUOROSCOPIC  FLUOROSCOPY TIME:  9 MINUTES 36 SECONDS, 1,024 MGY  MEDICATIONS AND MEDICAL HISTORY: 1 mg Versed, 50 mcg fentanyl  ANESTHESIA/SEDATION: 30 minutes  CONTRAST:  16mL OMNIPAQUE IOHEXOL 300 MG/ML  SOLN  COMPLICATIONS: None  immediate  PROCEDURE: Informed consent was obtained from the patient following explanation of the procedure, risks, benefits and alternatives. The patient understands, agrees and consents for the procedure. All questions were addressed. A time out was performed.  Maximal barrier sterile technique utilized including caps, mask, sterile gowns, sterile gloves, large sterile drape, hand hygiene, and ChloraPrep.  Under sterile conditions and local anesthesia, ultrasound micropuncture access performed of the right common femoral artery. Five French sheath inserted over a Bentson guidewire. C2 catheter advanced over guidewire and utilized initially to select the celiac artery. Selective celiac angiogram performed.  Celiac: Celiac origin is patent. Splenic, left gastric and hepatic vasculature are patent. Gastroduodenal artery patent. No active upper GI tract bleeding demonstrated.  Catheter was retracted and utilized to select the SMA origin. Selective SMA angiogram performed.  SMA: SMA origin is patent. Main SMA trunk is patent. Jejunal and colic branches appear patent. No evidence of active bleeding.  Catheter was retracted and exchanged for a Sos Omni select catheter. This catheter was utilized to select the IMA origin.  IMA: IMA main trunk is patent. The left colic and superior hemorrhoidal branches are all patent. No evidence of active bleeding.  Multiple attempts were made to access the left colic branches with a micro catheter and micro guidewire from the IMA origin. However the catheter and guidewire would not easily advance peripherally into the IMA. Repeat injection of the IMA demonstrates a small proximal IMA dissection. This is minimally flow limiting. No thrombus. Because of this, the procedure was stopped.  IMPRESSION: Successful celiac, SMA and IMA angiograms without evidence of active colonic lower GI bleeding.  Minor proximal IMA dissection related to catheter and guidewire manipulation without occlusion.    Electronically Signed   By: Jerilynn Mages.  Shick M.D.   On: 12/24/2014 15:47   Dg Chest Port 1 View  12/24/2014   CLINICAL DATA:  Doctor attempted central line placement on the left side. Ended up putting it in the femoral.  EXAM: PORTABLE CHEST - 1 VIEW  COMPARISON:  06/22/2011  FINDINGS: No pneumothorax following attempted central line placement.  Lungs are clear.  Heart, mediastinum and hila are unremarkable.  Old rib fractures on the left.  IMPRESSION: No acute cardiopulmonary disease.  No pneumothorax.   Electronically Signed   By: Lajean Manes M.D.   On: 12/24/2014 12:46    Microbiology: Recent Results (from the past 240 hour(s))  MRSA PCR Screening     Status: None   Collection Time: 12/24/14  8:36 AM  Result Value Ref Range Status   MRSA by PCR NEGATIVE NEGATIVE Final    Comment:        The GeneXpert MRSA Assay (FDA  approved for NASAL specimens only), is one component of a comprehensive MRSA colonization surveillance program. It is not intended to diagnose MRSA infection nor to guide or monitor treatment for MRSA infections.      Labs: Basic Metabolic Panel:  Recent Labs Lab 12/23/14 1658 12/24/14 0253 12/24/14 1515 12/25/14 0526 12/26/14 0359  NA 141 140 141 139 139  K 3.9 3.7 4.7 3.6 3.3*  CL 105 105 113* 108 105  CO2 30 29 25 27 28   GLUCOSE 117* 112* 134* 123* 133*  BUN 19 17 18 15 9   CREATININE 0.85 0.67 1.17 0.85 0.78  CALCIUM 9.4 8.7* 7.0* 7.3* 7.9*  MG  --   --  1.5*  --  1.9  PHOS  --   --  3.1  --  1.4*   Liver Function Tests:  Recent Labs Lab 12/23/14 1658 12/24/14 1515  AST 35 25  ALT 38 26  ALKPHOS 47 30*  BILITOT 0.7 1.2  PROT 6.1* 3.8*  ALBUMIN 3.7 2.2*   No results for input(s): LIPASE, AMYLASE in the last 168 hours. No results for input(s): AMMONIA in the last 168 hours. CBC:  Recent Labs Lab 12/25/14 1926 12/26/14 0053 12/26/14 0359 12/26/14 0752 12/27/14 0713  WBC 8.4 9.9 10.7* 10.4 10.0  NEUTROABS  --   --  7.2  --  8.0*  HGB  7.1* 7.1* 7.4* 7.2* 7.7*  HCT 20.7* 20.5* 21.4* 21.0* 22.7*  MCV 88.1 88.0 87.7 88.2 89.7  PLT 142* 151 146* 151 157   Cardiac Enzymes:  Recent Labs Lab 12/24/14 1515 12/24/14 2330 12/25/14 1033  TROPONINI <0.03 <0.03 <0.03   BNP: BNP (last 3 results) No results for input(s): BNP in the last 8760 hours.  ProBNP (last 3 results) No results for input(s): PROBNP in the last 8760 hours.  CBG:  Recent Labs Lab 12/24/14 0826 12/24/14 1553  GLUCAP 191* 121*       Signed:  Byren Pankow  [facility] 12/27/2014, 11:04 AM

## 2014-12-28 ENCOUNTER — Ambulatory Visit (INDEPENDENT_AMBULATORY_CARE_PROVIDER_SITE_OTHER)
Admission: EM | Admit: 2014-12-28 | Discharge: 2014-12-28 | Disposition: A | Discharge status: Home / Self Care | Payer: 59 | Source: Home / Self Care | Attending: Emergency Medicine | Admitting: Emergency Medicine

## 2014-12-28 ENCOUNTER — Encounter: Payer: Self-pay | Admitting: Emergency Medicine

## 2014-12-28 DIAGNOSIS — Z79899 Other long term (current) drug therapy: Secondary | ICD-10-CM | POA: Insufficient documentation

## 2014-12-28 DIAGNOSIS — K922 Gastrointestinal hemorrhage, unspecified: Secondary | ICD-10-CM | POA: Diagnosis not present

## 2014-12-28 DIAGNOSIS — Z87891 Personal history of nicotine dependence: Secondary | ICD-10-CM | POA: Insufficient documentation

## 2014-12-28 DIAGNOSIS — R338 Other retention of urine: Secondary | ICD-10-CM

## 2014-12-28 DIAGNOSIS — I10 Essential (primary) hypertension: Secondary | ICD-10-CM

## 2014-12-28 DIAGNOSIS — E785 Hyperlipidemia, unspecified: Secondary | ICD-10-CM | POA: Insufficient documentation

## 2014-12-28 DIAGNOSIS — R339 Retention of urine, unspecified: Secondary | ICD-10-CM

## 2014-12-28 DIAGNOSIS — K921 Melena: Secondary | ICD-10-CM | POA: Diagnosis not present

## 2014-12-28 DIAGNOSIS — K5790 Diverticulosis of intestine, part unspecified, without perforation or abscess without bleeding: Secondary | ICD-10-CM

## 2014-12-28 LAB — URINALYSIS COMPLETE WITH MICROSCOPIC (ARMC ONLY)
BILIRUBIN URINE: NEGATIVE
Glucose, UA: NEGATIVE mg/dL
Hgb urine dipstick: NEGATIVE
Ketones, ur: NEGATIVE mg/dL
LEUKOCYTES UA: NEGATIVE
Nitrite: NEGATIVE
PH: 5.5 (ref 5.0–8.0)
Protein, ur: NEGATIVE mg/dL
Specific Gravity, Urine: 1.025 (ref 1.005–1.030)

## 2014-12-28 MED ORDER — TAMSULOSIN HCL 0.4 MG PO CAPS: 0.4000 mg | ORAL_CAPSULE | Freq: Every day | ORAL | Status: DC

## 2014-12-28 NOTE — ED Provider Notes (Signed)
HPI  SUBJECTIVE:  Shawn Lowery is a 69 y.o. male who presents with acute urinary retention. Patient states that he has had urinary urgency, frequency, is "dribbling small amounts" approximately every 15 minutes since yesterday around 1400. He reports lower abdominal swelling, pain. No nausea, vomiting, fevers, back pain, testicular pain. No aggravating or alleviating factors. He has not tried anything for this. He was discharged yesterday from the ICU for GI bleed where he had a Foley, multiple in and out catheterizations performed. He was sent home on Keflex for UTI. There was some confusion about the Flomax prescription, and he just got it filled today. He has not taken any yet. Past medical history of GI bleed, BPH, hypertension, hypercholesterolemia.. No history of diabetes. Per med list review is not on any opiates currently. No other change in medications.    Past Medical History  Diagnosis Date  . Hyperlipemia   . Diverticulosis   . Diverticulitis   . Hypertension     History reviewed. No pertinent past surgical history.  Family History  Problem Relation Age of Onset  . Hypertension Father     Social History  Substance Use Topics  . Smoking status: Former Smoker -- 1.00 packs/day for 15 years  . Smokeless tobacco: Never Used  . Alcohol Use: No    No current facility-administered medications for this encounter.  Current outpatient prescriptions:  .  Ascorbic Acid (VITAMIN C) 1000 MG tablet, Take 1,000 mg by mouth daily., Disp: , Rfl:  .  cephALEXin (KEFLEX) 500 MG capsule, Take 1 capsule (500 mg total) by mouth 2 (two) times daily., Disp: 6 capsule, Rfl: 0 .  Fluticasone-Salmeterol (ADVAIR) 100-50 MCG/DOSE AEPB, Inhale 1 puff into the lungs 2 (two) times daily as needed (for wheezing or shortness of breath). , Disp: , Rfl:  .  lisinopril-hydrochlorothiazide (PRINZIDE,ZESTORETIC) 20-25 MG per tablet, Take 1 tablet by mouth daily., Disp: , Rfl:  .  lovastatin (MEVACOR) 20 MG  tablet, Take 20 mg by mouth daily. , Disp: , Rfl:  .  Multiple Vitamin (MULTIVITAMIN WITH MINERALS) TABS tablet, Take 1 tablet by mouth every Monday, Wednesday, and Friday., Disp: , Rfl:  .  polyethylene glycol (MIRALAX / GLYCOLAX) packet, Take 17 g by mouth 3 (three) times daily., Disp: 14 each, Rfl: 0 .  sodium chloride (OCEAN) 0.65 % SOLN nasal spray, Place 1 spray into both nostrils as needed for congestion., Disp: , Rfl:  .  tamsulosin (FLOMAX) 0.4 MG CAPS capsule, Take 1 capsule (0.4 mg total) by mouth daily after supper., Disp: 30 capsule, Rfl: 0  No Known Allergies   ROS  As noted in HPI.   Physical Exam  BP 141/50 mmHg  Pulse 88  Temp(Src) 97.9 F (36.6 C) (Oral)  Resp 18  Ht 5\' 11"  (1.803 m)  Wt 194 lb (87.998 kg)  BMI 27.07 kg/m2  SpO2 99%  Constitutional: Well developed, well nourished, appears uncomfortable Eyes:  EOMI, conjunctiva normal bilaterally HENT: Normocephalic, atraumatic,mucus membranes moist Respiratory: Normal inspiratory effort Cardiovascular: Normal rate GI: Tender, palpable bladder up to the umbilicus, normal bowel sounds, otherwise soft, nontender. no flank tenderness No CVA tenderness skin: No rash, skin intact Musculoskeletal: no deformities Neurologic: Alert & oriented x 3, no focal neuro deficits Psychiatric: Speech and behavior appropriate   ED Course   Medications - No data to display  Orders Placed This Encounter  Procedures  . Urine culture    Standing Status: Standing     Number of Occurrences: 1  Standing Expiration Date:     Order Specific Question:  List patient's active antibiotics    Answer:  keflex  . Urinalysis complete, with microscopic    Standing Status: Standing     Number of Occurrences: 1     Standing Expiration Date:     Results for orders placed or performed during the hospital encounter of 12/28/14 (from the past 24 hour(s))  Urinalysis complete, with microscopic     Status: Abnormal   Collection  Time: 12/28/14  5:38 PM  Result Value Ref Range   Color, Urine YELLOW YELLOW   APPearance HAZY (A) CLEAR   Glucose, UA NEGATIVE NEGATIVE mg/dL   Bilirubin Urine NEGATIVE NEGATIVE   Ketones, ur NEGATIVE NEGATIVE mg/dL   Specific Gravity, Urine 1.025 1.005 - 1.030   Hgb urine dipstick NEGATIVE NEGATIVE   pH 5.5 5.0 - 8.0   Protein, ur NEGATIVE NEGATIVE mg/dL   Nitrite NEGATIVE NEGATIVE   Leukocytes, UA NEGATIVE NEGATIVE   RBC / HPF 6-30 <3 RBC/hpf   WBC, UA TOO NUMEROUS TO COUNT <3 WBC/hpf   Bacteria, UA MANY (A) RARE   Squamous Epithelial / LPF 0-5 (A) RARE   No results found.  ED Clinical Impression  Acute urinary retention   ED Assessment/Plan Patient was able to urinate approximately 20 cc on his own. This was sent off for urinalysis. Foley was placed, PVR 1000 mL. Patient immediately felt better. Repeat abdominal exam soft, nontender.  UA with pyuria, bacteriuria, negative for nitrites, esterase. u cx sent  D/w Dr. Matilde Sprang, urology on call. Plan to leave the Foley in until Monday. He may call either the [facility] office or the [place] office tomorrow for follow-up on Monday.Marland Kitchen He is to arrive early in the morning. He is to continue the Flomax, and Keflex.  Discussed labs,  MDM, plan and followup with patient. Discussed sn/sx that should prompt return to the UC or ED. Patient  agrees with plan.   *This clinic note was created using Dragon dictation software. Therefore, there may be occasional mistakes despite careful proofreading.  ?   Melynda Ripple, MD 12/28/14 2150

## 2014-12-28 NOTE — ED Notes (Signed)
Pt not able to void.   

## 2014-12-28 NOTE — Discharge Instructions (Signed)
You need to call either the [facility], [facility] office, or [facility], tomorrow to arrange a follow-up appointment on Monday. You will need to leave the catheter in until Monday. Continue the Flomax and the Keflex. Go to the ER for fever above 100.4, abdominal pain, back pain, or other concerns.

## 2014-12-29 ENCOUNTER — Telehealth: Payer: Self-pay

## 2014-12-29 NOTE — ED Notes (Signed)
Patient's wife is here and states they are unable to get an appointment with [facility] until 9/12, and they are very nervous to leave in urinary catheter until then as they fear infection. Wife ask if writer could call [facility] and get an appointment in [place] any earlier. Writer spoke with Roselyn Reef nurse at [facility] and she took patient information and is aware that Dr. Alphonzo Cruise spoke with Dr. Matilde Sprang last evening and he request that patient be seen in [place] or [place] office for voiding trial on Monday 9/5. Roselyn Reef said she will closely check schedule and will call writer back with an appointment. Mrs. Ertl made aware that Probation officer will call her when appointment is made.   Mrs. Bleicher said Mr. Lindstrom had a restful night. She states he does seem swollen, and his weight is up since admission to hospital. Given signs and symptoms of CHF, and instructed to take patient to the hospital if signs or symptoms occur. Pt did receive multiple bags of IV fluids and blood transfusions.

## 2014-12-30 DIAGNOSIS — I2510 Atherosclerotic heart disease of native coronary artery without angina pectoris: Secondary | ICD-10-CM | POA: Diagnosis present

## 2014-12-30 DIAGNOSIS — E785 Hyperlipidemia, unspecified: Secondary | ICD-10-CM | POA: Diagnosis present

## 2014-12-30 DIAGNOSIS — E876 Hypokalemia: Secondary | ICD-10-CM | POA: Diagnosis present

## 2014-12-30 DIAGNOSIS — K921 Melena: Principal | ICD-10-CM | POA: Diagnosis present

## 2014-12-30 DIAGNOSIS — N401 Enlarged prostate with lower urinary tract symptoms: Secondary | ICD-10-CM | POA: Diagnosis present

## 2014-12-30 DIAGNOSIS — R7302 Impaired glucose tolerance (oral): Secondary | ICD-10-CM | POA: Diagnosis present

## 2014-12-30 DIAGNOSIS — R338 Other retention of urine: Secondary | ICD-10-CM | POA: Diagnosis present

## 2014-12-30 DIAGNOSIS — I10 Essential (primary) hypertension: Secondary | ICD-10-CM | POA: Diagnosis present

## 2014-12-30 DIAGNOSIS — Z87891 Personal history of nicotine dependence: Secondary | ICD-10-CM

## 2014-12-30 DIAGNOSIS — J45909 Unspecified asthma, uncomplicated: Secondary | ICD-10-CM | POA: Diagnosis present

## 2014-12-30 LAB — CBC
HEMATOCRIT: 22.4 % — AB (ref 40.0–52.0)
HEMOGLOBIN: 7.7 g/dL — AB (ref 13.0–18.0)
MCH: 30.1 pg (ref 26.0–34.0)
MCHC: 34.2 g/dL (ref 32.0–36.0)
MCV: 88 fL (ref 80.0–100.0)
Platelets: 280 10*3/uL (ref 150–440)
RBC: 2.55 MIL/uL — AB (ref 4.40–5.90)
RDW: 14.5 % (ref 11.5–14.5)
WBC: 5.4 10*3/uL (ref 3.8–10.6)

## 2014-12-30 NOTE — ED Notes (Signed)
Patient reports noticed blood in stool this evening.  Patient reports recent hospitalization for same.

## 2014-12-31 ENCOUNTER — Inpatient Hospital Stay
Admission: EM | Admit: 2014-12-31 | Discharge: 2015-01-01 | DRG: 379 | Disposition: A | Discharge status: Home / Self Care | Payer: 59 | Attending: Internal Medicine | Admitting: Internal Medicine

## 2014-12-31 ENCOUNTER — Encounter: Payer: Self-pay | Admitting: Internal Medicine

## 2014-12-31 DIAGNOSIS — I10 Essential (primary) hypertension: Secondary | ICD-10-CM | POA: Diagnosis present

## 2014-12-31 DIAGNOSIS — K922 Gastrointestinal hemorrhage, unspecified: Secondary | ICD-10-CM | POA: Diagnosis present

## 2014-12-31 DIAGNOSIS — N401 Enlarged prostate with lower urinary tract symptoms: Secondary | ICD-10-CM

## 2014-12-31 DIAGNOSIS — J45909 Unspecified asthma, uncomplicated: Secondary | ICD-10-CM | POA: Diagnosis present

## 2014-12-31 DIAGNOSIS — K921 Melena: Secondary | ICD-10-CM | POA: Diagnosis present

## 2014-12-31 DIAGNOSIS — R339 Retention of urine, unspecified: Secondary | ICD-10-CM

## 2014-12-31 DIAGNOSIS — Z87891 Personal history of nicotine dependence: Secondary | ICD-10-CM | POA: Diagnosis not present

## 2014-12-31 DIAGNOSIS — E785 Hyperlipidemia, unspecified: Secondary | ICD-10-CM | POA: Diagnosis present

## 2014-12-31 DIAGNOSIS — R7302 Impaired glucose tolerance (oral): Secondary | ICD-10-CM | POA: Diagnosis present

## 2014-12-31 DIAGNOSIS — E876 Hypokalemia: Secondary | ICD-10-CM | POA: Diagnosis present

## 2014-12-31 DIAGNOSIS — I2510 Atherosclerotic heart disease of native coronary artery without angina pectoris: Secondary | ICD-10-CM | POA: Diagnosis present

## 2014-12-31 DIAGNOSIS — K625 Hemorrhage of anus and rectum: Secondary | ICD-10-CM

## 2014-12-31 DIAGNOSIS — R338 Other retention of urine: Secondary | ICD-10-CM

## 2014-12-31 HISTORY — DX: Atherosclerotic heart disease of native coronary artery without angina pectoris: I25.10

## 2014-12-31 HISTORY — DX: Impaired glucose tolerance (oral): R73.02

## 2014-12-31 HISTORY — DX: Unspecified asthma, uncomplicated: J45.909

## 2014-12-31 HISTORY — DX: Hemorrhage of anus and rectum: K62.5

## 2014-12-31 LAB — GLUCOSE, CAPILLARY
GLUCOSE-CAPILLARY: 110 mg/dL — AB (ref 65–99)
Glucose-Capillary: 178 mg/dL — ABNORMAL HIGH (ref 65–99)
Glucose-Capillary: 117 mg/dL — ABNORMAL HIGH (ref 65–99)
Glucose-Capillary: 136 mg/dL — ABNORMAL HIGH (ref 65–99)

## 2014-12-31 LAB — TYPE AND SCREEN
ABO/RH(D): O POS
Antibody Screen: NEGATIVE

## 2014-12-31 LAB — HEMOGLOBIN
HEMOGLOBIN: 7.3 g/dL — AB (ref 13.0–18.0)
Hemoglobin: 7.2 g/dL — ABNORMAL LOW (ref 13.0–18.0)
Hemoglobin: 7.6 g/dL — ABNORMAL LOW (ref 13.0–18.0)

## 2014-12-31 LAB — COMPREHENSIVE METABOLIC PANEL
ALT: 44 U/L (ref 17–63)
AST: 32 U/L (ref 15–41)
Albumin: 2.8 g/dL — ABNORMAL LOW (ref 3.5–5.0)
Alkaline Phosphatase: 46 U/L (ref 38–126)
Anion gap: 7 (ref 5–15)
BUN: 21 mg/dL — ABNORMAL HIGH (ref 6–20)
CHLORIDE: 100 mmol/L — AB (ref 101–111)
CO2: 28 mmol/L (ref 22–32)
Calcium: 8.3 mg/dL — ABNORMAL LOW (ref 8.9–10.3)
Creatinine, Ser: 0.89 mg/dL (ref 0.61–1.24)
Glucose, Bld: 124 mg/dL — ABNORMAL HIGH (ref 65–99)
POTASSIUM: 3.2 mmol/L — AB (ref 3.5–5.1)
Sodium: 135 mmol/L (ref 135–145)
Total Bilirubin: 0.4 mg/dL (ref 0.3–1.2)
Total Protein: 6.4 g/dL — ABNORMAL LOW (ref 6.5–8.1)

## 2014-12-31 LAB — URINE CULTURE

## 2014-12-31 LAB — BASIC METABOLIC PANEL
ANION GAP: 5 (ref 5–15)
BUN: 17 mg/dL (ref 6–20)
CHLORIDE: 102 mmol/L (ref 101–111)
CO2: 30 mmol/L (ref 22–32)
Calcium: 8.3 mg/dL — ABNORMAL LOW (ref 8.9–10.3)
Creatinine, Ser: 0.89 mg/dL (ref 0.61–1.24)
GFR calc Af Amer: >60 mL/min (ref >60)
GLUCOSE: 121 mg/dL — AB (ref 65–99)
POTASSIUM: 3.3 mmol/L — AB (ref 3.5–5.1)
Sodium: 137 mmol/L (ref 135–145)

## 2014-12-31 LAB — CBC
HEMATOCRIT: 21.8 % — AB (ref 40.0–52.0)
HEMOGLOBIN: 7.4 g/dL — AB (ref 13.0–18.0)
MCH: 30 pg (ref 26.0–34.0)
MCHC: 33.8 g/dL (ref 32.0–36.0)
MCV: 88.9 fL (ref 80.0–100.0)
Platelets: 245 10*3/uL (ref 150–440)
RBC: 2.45 MIL/uL — ABNORMAL LOW (ref 4.40–5.90)
RDW: 14.8 % — AB (ref 11.5–14.5)
WBC: 5.1 10*3/uL (ref 3.8–10.6)

## 2014-12-31 MED ORDER — SODIUM CHLORIDE 0.9 % IV SOLN
INTRAVENOUS | Status: DC
  Administered 2014-12-31: 03:00:00 via INTRAVENOUS

## 2014-12-31 MED ORDER — FINASTERIDE 5 MG PO TABS
5.0000 mg | ORAL_TABLET | Freq: Every day | ORAL | Status: DC
  Administered 2014-12-31 – 2015-01-01 (×2): 5 mg via ORAL
  Filled 2014-12-31 (×2): qty 1

## 2014-12-31 MED ORDER — ACETAMINOPHEN 325 MG PO TABS: 650.0000 mg | ORAL_TABLET | Freq: Four times a day (QID) | ORAL | Status: DC | PRN

## 2014-12-31 MED ORDER — HYDROCHLOROTHIAZIDE 25 MG PO TABS
25.0000 mg | ORAL_TABLET | Freq: Every day | ORAL | Status: DC
  Administered 2014-12-31 – 2015-01-01 (×2): 25 mg via ORAL
  Filled 2014-12-31 (×2): qty 1

## 2014-12-31 MED ORDER — ONDANSETRON HCL 4 MG/2ML IJ SOLN: 4.0000 mg | Freq: Four times a day (QID) | INTRAMUSCULAR | Status: DC | PRN

## 2014-12-31 MED ORDER — ACETAMINOPHEN 650 MG RE SUPP: 650.0000 mg | Freq: Four times a day (QID) | RECTAL | Status: DC | PRN

## 2014-12-31 MED ORDER — MOMETASONE FURO-FORMOTEROL FUM 100-5 MCG/ACT IN AERO
2.0000 | INHALATION_SPRAY | Freq: Two times a day (BID) | RESPIRATORY_TRACT | Status: DC
  Administered 2014-12-31 – 2015-01-01 (×3): 2 via RESPIRATORY_TRACT
  Filled 2014-12-31: qty 8.8

## 2014-12-31 MED ORDER — PANTOPRAZOLE SODIUM 40 MG IV SOLR
40.0000 mg | Freq: Two times a day (BID) | INTRAVENOUS | Status: DC
  Administered 2014-12-31 – 2015-01-01 (×3): 40 mg via INTRAVENOUS
  Filled 2014-12-31 (×3): qty 40

## 2014-12-31 MED ORDER — LISINOPRIL-HYDROCHLOROTHIAZIDE 20-25 MG PO TABS: 1.0000 | ORAL_TABLET | Freq: Every day | ORAL | Status: DC

## 2014-12-31 MED ORDER — ONDANSETRON HCL 4 MG PO TABS: 4.0000 mg | ORAL_TABLET | Freq: Four times a day (QID) | ORAL | Status: DC | PRN

## 2014-12-31 MED ORDER — LISINOPRIL 20 MG PO TABS
20.0000 mg | ORAL_TABLET | Freq: Every day | ORAL | Status: DC
  Administered 2014-12-31 – 2015-01-01 (×2): 20 mg via ORAL
  Filled 2014-12-31 (×2): qty 1

## 2014-12-31 MED ORDER — SODIUM CHLORIDE 0.9 % IJ SOLN
3.0000 mL | Freq: Two times a day (BID) | INTRAMUSCULAR | Status: DC
  Administered 2014-12-31 – 2015-01-01 (×2): 3 mL via INTRAVENOUS

## 2014-12-31 MED ORDER — PRAVASTATIN SODIUM 20 MG PO TABS
20.0000 mg | ORAL_TABLET | Freq: Every day | ORAL | Status: DC
  Administered 2014-12-31: 18:00:00 20 mg via ORAL
  Filled 2014-12-31: qty 1

## 2014-12-31 MED ORDER — TAMSULOSIN HCL 0.4 MG PO CAPS
0.4000 mg | ORAL_CAPSULE | Freq: Every day | ORAL | Status: DC
  Administered 2014-12-31: 0.4 mg via ORAL
  Filled 2014-12-31: qty 1

## 2014-12-31 MED ORDER — POTASSIUM CHLORIDE CRYS ER 20 MEQ PO TBCR
40.0000 meq | EXTENDED_RELEASE_TABLET | Freq: Once | ORAL | Status: AC
  Administered 2014-12-31: 14:00:00 40 meq via ORAL
  Filled 2014-12-31: qty 2

## 2014-12-31 NOTE — Consult Note (Signed)
GI Inpatient Consult Note  Reason for Consult:   Attending Requesting Consult:  History of Present Illness: Shawn Lowery is a 69 y.o. male with rectal bleeding x 1 last night. None since. No GI sxs now. Had significant LGI bleeding last week on bASA. Had positive bleeding scan in desc colon. Sent to [facility] for angiogram. However, by the time of angiogram, bleeding stopped. So, no embolization done. Last colonoscopy about 10 yrs ago. Eating solids now.  Past Medical History:  Past Medical History  Diagnosis Date  . Hyperlipemia   . Diverticulosis   . Diverticulitis   . Hypertension   . CAD (coronary artery disease)   . Asthma   . Rectal bleeding   . IGT (impaired glucose tolerance)     Problem List: Patient Active Problem List   Diagnosis Date Noted  . CAD (coronary artery disease) 12/31/2014  . IGT (impaired glucose tolerance) 12/31/2014  . Lower GI bleed 12/31/2014  . Hypotension 12/27/2014  . Essential hypertension 12/27/2014  . BPH (benign prostatic hypertrophy) with urinary retention 12/27/2014  . Diverticular hemorrhage 12/27/2014  . Hypomagnesemia 12/25/2014  . Chronic sinusitis 12/25/2014  . Rectal bleed 12/24/2014  . Acute lower GI bleeding   . Poor venous access   . GI bleed 12/23/2014    Past Surgical History: Past Surgical History  Procedure Laterality Date  . Hernia repair      Allergies: No Known Allergies  Home Medications: Prescriptions prior to admission  Medication Sig Dispense Refill Last Dose  . Ascorbic Acid (VITAMIN C) 1000 MG tablet Take 1,000 mg by mouth daily.   12/23/2014 at Unknown time  . Fluticasone-Salmeterol (ADVAIR) 100-50 MCG/DOSE AEPB Inhale 1 puff into the lungs 2 (two) times daily as needed (for wheezing or shortness of breath).    12/23/2014 at Unknown time  . lisinopril-hydrochlorothiazide (PRINZIDE,ZESTORETIC) 20-25 MG per tablet Take 1 tablet by mouth daily.   12/23/2014 at Unknown time  . lovastatin (MEVACOR) 20 MG tablet  Take 20 mg by mouth daily.    12/23/2014 at Unknown time  . Multiple Vitamin (MULTIVITAMIN WITH MINERALS) TABS tablet Take 1 tablet by mouth every Monday, Wednesday, and Friday.   12/23/2014 at Unknown time  . polyethylene glycol (MIRALAX / GLYCOLAX) packet Take 17 g by mouth 3 (three) times daily. 14 each 0   . sodium chloride (OCEAN) 0.65 % SOLN nasal spray Place 1 spray into both nostrils as needed for congestion.   6 months  . tamsulosin (FLOMAX) 0.4 MG CAPS capsule Take 1 capsule (0.4 mg total) by mouth daily after supper. 30 capsule 0    Home medication reconciliation was completed with the patient.   Scheduled Inpatient Medications:   . lisinopril  20 mg Oral Daily   And  . hydrochlorothiazide  25 mg Oral Daily  . mometasone-formoterol  2 puff Inhalation BID  . pantoprazole (PROTONIX) IV  40 mg Intravenous Q12H  . pravastatin  20 mg Oral q1800  . sodium chloride  3 mL Intravenous Q12H  . tamsulosin  0.4 mg Oral QPC supper    Continuous Inpatient Infusions:   . sodium chloride 75 mL/hr at 12/31/14 0323    PRN Inpatient Medications:  acetaminophen **OR** acetaminophen, ondansetron **OR** ondansetron (ZOFRAN) IV  Family History: family history includes CAD in his father; Hypertension in his father; Stroke in his mother.  The patient's family history is negative for inflammatory bowel disorders, GI malignancy, or solid organ transplantation.  Social History:   reports that he  has quit smoking. He quit smokeless tobacco use about 31 years ago. He reports that he does not drink alcohol or use illicit drugs. The patient denies ETOH, tobacco, or drug use.   Review of Systems: Constitutional: Weight is stable.  Eyes: No changes in vision. ENT: No oral lesions, sore throat.  GI: see HPI.  Heme/Lymph: No easy bruising.  CV: No chest pain.  GU: No hematuria.  Integumentary: No rashes.  Neuro: No headaches.  Psych: No depression/anxiety.  Endocrine: No heat/cold intolerance.   Allergic/Immunologic: No urticaria.  Resp: No cough, SOB.  Musculoskeletal: No joint swelling.    Physical Examination: BP 131/58 mmHg  Pulse 84  Temp(Src) 98.7 F (37.1 C) (Oral)  Resp 18  Ht 5\' 11"  (1.803 m)  Wt 89.858 kg (198 lb 1.6 oz)  BMI 27.64 kg/m2  SpO2 100% Gen: NAD, alert and oriented x 4 HEENT: PEERLA, EOMI, Neck: supple, no JVD or thyromegaly Chest: CTA bilaterally, no wheezes, crackles, or other adventitious sounds CV: RRR, no m/g/c/r Abd: soft, NT, ND, +BS in all four quadrants; no HSM, guarding, ridigity, or rebound tenderness Ext: no edema, well perfused with 2+ pulses, Skin: no rash or lesions noted Lymph: no LAD  Data: Lab Results  Component Value Date   WBC 5.1 12/31/2014   HGB 7.4* 12/31/2014   HCT 21.8* 12/31/2014   MCV 88.9 12/31/2014   PLT 245 12/31/2014    Recent Labs Lab 12/27/14 0713 12/30/14 2325 12/31/14 0430  HGB 7.7* 7.7* 7.4*   Lab Results  Component Value Date   NA 137 12/31/2014   K 3.3* 12/31/2014   CL 102 12/31/2014   CO2 30 12/31/2014   BUN 17 12/31/2014   CREATININE 0.89 12/31/2014   Lab Results  Component Value Date   ALT 44 12/30/2014   AST 32 12/30/2014   ALKPHOS 46 12/30/2014   BILITOT 0.4 12/30/2014    Recent Labs Lab 12/24/14 1515  APTT 27  INR 1.42   Assessment/Plan: Mr. Shawn Lowery is a 69 y.o. male with likely diverticular bleeding.   Recommendations: See how he does rest of today. If no further bleeding, then discharge by tomorrow off ASA. Will arrange outpt colonoscopy in few weeks. If bleeding again today, repeat bleeding scan. If positive, then consult vasc surgery for angiogram. If bleeding scan neg, then will prep pt tomorrow for colonoscopy on Monday.  Thank you for the consult. Please call with questions or concerns.  Trei Schoch, Lupita Dawn, MD

## 2014-12-31 NOTE — ED Provider Notes (Signed)
[facility] Emergency Department Provider Note  Time seen: 1:11 AM  I have reviewed the triage vital signs and the nursing notes.   HISTORY  Chief Complaint Rectal Bleeding    HPI Shawn Lowery is a 69 y.o. male with a past medical history of hyperlipidemia, diverticulosis, diverticulitis, hypertension who presents the emergency department with bloody stool. According to the patient he was discharged approximately 3 days ago after an admission for lower GI bleed.Patient states he has been doing well since then, no further bowel movements until tonight. Around 9:30 PM patient had a bowel movement and noted blood when he wiped, so he came back to the emergency department for evaluation. Denies any abdominal pain, fever, black stool.     Past Medical History  Diagnosis Date  . Hyperlipemia   . Diverticulosis   . Diverticulitis   . Hypertension     Patient Active Problem List   Diagnosis Date Noted  . Hypotension 12/27/2014  . Essential hypertension 12/27/2014  . BPH (benign prostatic hypertrophy) with urinary retention 12/27/2014  . Diverticular hemorrhage 12/27/2014  . Hypomagnesemia 12/25/2014  . Chronic sinusitis 12/25/2014  . Rectal bleed 12/24/2014  . Acute lower GI bleeding   . Poor venous access   . GI bleed 12/23/2014    No past surgical history on file.  Current Outpatient Rx  Name  Route  Sig  Dispense  Refill  . Ascorbic Acid (VITAMIN C) 1000 MG tablet   Oral   Take 1,000 mg by mouth daily.         . Fluticasone-Salmeterol (ADVAIR) 100-50 MCG/DOSE AEPB   Inhalation   Inhale 1 puff into the lungs 2 (two) times daily as needed (for wheezing or shortness of breath).          Marland Kitchen lisinopril-hydrochlorothiazide (PRINZIDE,ZESTORETIC) 20-25 MG per tablet   Oral   Take 1 tablet by mouth daily.         Marland Kitchen lovastatin (MEVACOR) 20 MG tablet   Oral   Take 20 mg by mouth daily.          . Multiple Vitamin (MULTIVITAMIN WITH MINERALS)  TABS tablet   Oral   Take 1 tablet by mouth every Monday, Wednesday, and Friday.         . polyethylene glycol (MIRALAX / GLYCOLAX) packet   Oral   Take 17 g by mouth 3 (three) times daily.   14 each   0   . sodium chloride (OCEAN) 0.65 % SOLN nasal spray   Each Nare   Place 1 spray into both nostrils as needed for congestion.         . tamsulosin (FLOMAX) 0.4 MG CAPS capsule   Oral   Take 1 capsule (0.4 mg total) by mouth daily after supper.   30 capsule   0     Allergies Review of patient's allergies indicates no known allergies.  Family History  Problem Relation Age of Onset  . Hypertension Father     Social History Social History  Substance Use Topics  . Smoking status: Former Smoker -- 1.00 packs/day for 15 years  . Smokeless tobacco: Never Used  . Alcohol Use: No    Review of Systems Constitutional: Negative for fever. Cardiovascular: Negative for chest pain. Respiratory: Negative for shortness of breath. Gastrointestinal: Negative for abdominal pain. Positive for bloody stool. Genitourinary: Negative for dysuria. Musculoskeletal: Negative for back pain. Neurological: Negative for headache 10-point ROS otherwise negative.  ____________________________________________   PHYSICAL EXAM:  VITAL SIGNS: ED Triage Vitals  Enc Vitals Group     BP 12/30/14 2314 140/54 mmHg     Pulse Rate 12/30/14 2314 80     Resp 12/30/14 2314 20     Temp 12/30/14 2314 98 F (36.7 C)     Temp Source 12/30/14 2314 Oral     SpO2 12/30/14 2314 96 %     Weight 12/30/14 2314 194 lb (87.998 kg)     Height 12/30/14 2314 5\' 11"  (1.803 m)     Head Cir --      Peak Flow --      Pain Score --      Pain Loc --      Pain Edu? --      Excl. in [place]? --     Constitutional: Alert and oriented. Well appearing and in no distress. Eyes: Normal exam ENT   Mouth/Throat: Mucous membranes are moist. Cardiovascular: Normal rate, regular rhythm Respiratory: Normal respiratory  effort without tachypnea nor retractions. Breath sounds are clear and equal bilaterally. No wheezes/rales/rhonchi. Gastrointestinal: Soft and nontender. No distention. Rectal exam shows gross blood, strongly guaiac positive. Nontender. Musculoskeletal: Nontender with normal range of motion in all extremities. Neurologic:  Normal speech and language. No gross focal neurologic deficits  Skin:  Skin is warm, dry and intact.  Psychiatric: Mood and affect are normal. Speech and behavior are normal.   ____________________________________________   INITIAL IMPRESSION / ASSESSMENT AND PLAN / ED COURSE  Pertinent labs & imaging results that were available during my care of the patient were reviewed by me and considered in my medical decision making (see chart for details).  Patient with recent admission for lower GI bleed discharge 3 days ago. During his admission no source for the bleed was identified on the angiogram. Patient remains anemic with hemoglobin of 7.7 which is largely unchanged, but is concerning with low given an active GI bleed once again. Plan to admit the patient for further workup and evaluation. Patient agreeable to plan.  ____________________________________________   FINAL CLINICAL IMPRESSION(S) / ED DIAGNOSES  Lower GI bleed.   Harvest Dark, MD 12/31/14 [phone]

## 2014-12-31 NOTE — Plan of Care (Signed)
Problem: Discharge Progression Outcomes Goal: Other Discharge Outcomes/Goals Outcome: Progressing Plan of care progress to goal: GI Bleed. Pt saw blood on tissue after BM this morning. GI consult done. Urology consult done. proscar added.  Foley reinserted this morning d/t urinary retention. No c/o pain this afternoon. IVF discontinued this afternoon.

## 2014-12-31 NOTE — Progress Notes (Signed)
Notified MD of pt inability to void. Bladder scan showed 772ml. Ordered to insert foley for acute urinary retention

## 2014-12-31 NOTE — H&P (Signed)
[facility] NAME: Shawn Lowery    MR#:  [id number]  DATE OF BIRTH:  Dec 22, 1945  DATE OF ADMISSION:  12/31/2014  PRIMARY CARE PHYSICIAN: Sallee Lange, NP   REQUESTING/REFERRING PHYSICIAN: Kerman Passey, MD  CHIEF COMPLAINT:   Chief Complaint  Patient presents with  . Rectal Bleeding    HISTORY OF PRESENT ILLNESS:  Shawn Lowery  is a 69 y.o. male who presents with bleeding per rectum. Patient was recently admitted here for the same, and was sent out after spontaneous resolution. Patient's hemoglobin did drop at that time he was hypotensive when he initially presented during that admission. He presents today with bleeding noted per rectum when he had a bowel movement. He is hemodynamically stable at this time, and his hemoglobin is the same as it was when he was discharged several days ago. He denies any abdominal pain or diarrhea. He denies any nausea or vomiting. He denies any other infectious symptoms such as fever, chills. He does have a Foley catheter in place due to acute urinary retention during his last hospital stay. Hospitalists were called for admission for GI bleed.  PAST MEDICAL HISTORY:   Past Medical History  Diagnosis Date  . Hyperlipemia   . Diverticulosis   . Diverticulitis   . Hypertension   . CAD (coronary artery disease)   . Asthma   . Rectal bleeding   . IGT (impaired glucose tolerance)     PAST SURGICAL HISTORY:   Past Surgical History  Procedure Laterality Date  . Hernia repair      SOCIAL HISTORY:   Social History  Substance Use Topics  . Smoking status: Former Smoker -- 1.00 packs/day for 15 years  . Smokeless tobacco: Never Used  . Alcohol Use: No    FAMILY HISTORY:   Family History  Problem Relation Age of Onset  . Hypertension Father   . CAD Father   . Stroke Mother     DRUG ALLERGIES:  No Known Allergies  MEDICATIONS AT HOME:   Prior to Admission medications    Medication Sig Start Date End Date Taking? Authorizing Provider  Ascorbic Acid (VITAMIN C) 1000 MG tablet Take 1,000 mg by mouth daily.   Yes Historical Provider, MD  Fluticasone-Salmeterol (ADVAIR) 100-50 MCG/DOSE AEPB Inhale 1 puff into the lungs 2 (two) times daily as needed (for wheezing or shortness of breath).    Yes Historical Provider, MD  lisinopril-hydrochlorothiazide (PRINZIDE,ZESTORETIC) 20-25 MG per tablet Take 1 tablet by mouth daily.   Yes Historical Provider, MD  lovastatin (MEVACOR) 20 MG tablet Take 20 mg by mouth daily.    Yes Historical Provider, MD  Multiple Vitamin (MULTIVITAMIN WITH MINERALS) TABS tablet Take 1 tablet by mouth every Monday, Wednesday, and Friday.   Yes Historical Provider, MD  polyethylene glycol (MIRALAX / GLYCOLAX) packet Take 17 g by mouth 3 (three) times daily. 12/27/14  Yes Nishant Dhungel, MD  sodium chloride (OCEAN) 0.65 % SOLN nasal spray Place 1 spray into both nostrils as needed for congestion.   Yes Historical Provider, MD  tamsulosin (FLOMAX) 0.4 MG CAPS capsule Take 1 capsule (0.4 mg total) by mouth daily after supper. 12/28/14  Yes Donne Hazel, MD    REVIEW OF SYSTEMS:  Review of Systems  Constitutional: Negative for fever, chills, weight loss and malaise/fatigue.  HENT: Negative for ear pain, hearing loss and tinnitus.   Eyes: Negative for blurred vision, double vision, pain and redness.  Respiratory:  Negative for cough, hemoptysis and shortness of breath.   Cardiovascular: Negative for chest pain, palpitations, orthopnea and leg swelling.  Gastrointestinal: Positive for blood in stool. Negative for nausea, vomiting, abdominal pain, diarrhea and constipation.  Genitourinary: Negative for dysuria, frequency and hematuria.       Retention, with Foley in place  Musculoskeletal: Negative for back pain, joint pain and neck pain.  Skin:       No acne, rash, or lesions  Neurological: Negative for dizziness, tremors, focal weakness and  weakness.  Endo/Heme/Allergies: Negative for polydipsia. Does not bruise/bleed easily.  Psychiatric/Behavioral: Negative for depression. The patient is not nervous/anxious and does not have insomnia.      VITAL SIGNS:   Filed Vitals:   12/30/14 2314  BP: 140/54  Pulse: 80  Temp: 98 F (36.7 C)  TempSrc: Oral  Resp: 20  Height: 5\' 11"  (1.803 m)  Weight: 87.998 kg (194 lb)  SpO2: 96%   Wt Readings from Last 3 Encounters:  12/30/14 87.998 kg (194 lb)  12/28/14 87.998 kg (194 lb)  12/26/14 98 kg (216 lb 0.8 oz)    PHYSICAL EXAMINATION:  Physical Exam  Vitals reviewed. Constitutional: He is oriented to person, place, and time. He appears well-developed and well-nourished. No distress.  HENT:  Head: Normocephalic and atraumatic.  Mouth/Throat: Oropharynx is clear and moist.  Eyes: Conjunctivae and EOM are normal. Pupils are equal, round, and reactive to light. No scleral icterus.  Neck: Normal range of motion. Neck supple. No JVD present. No thyromegaly present.  Cardiovascular: Normal rate, regular rhythm and intact distal pulses.  Exam reveals no gallop and no friction rub.   No murmur heard. Respiratory: Effort normal and breath sounds normal. No respiratory distress. He has no wheezes. He has no rales.  GI: Soft. Bowel sounds are normal. He exhibits no distension. There is no tenderness.  Genitourinary:  Foley catheter in place.  Musculoskeletal: Normal range of motion. He exhibits no edema.  No arthritis, no gout  Lymphadenopathy:    He has no cervical adenopathy.  Neurological: He is alert and oriented to person, place, and time. No cranial nerve deficit.  No dysarthria, no aphasia  Skin: Skin is warm and dry. No rash noted. No erythema.  Psychiatric: He has a normal mood and affect. His behavior is normal. Judgment and thought content normal.    LABORATORY PANEL:   CBC  Recent Labs Lab 12/30/14 2325  WBC 5.4  HGB 7.7*  HCT 22.4*  PLT 280    ------------------------------------------------------------------------------------------------------------------  Chemistries   Recent Labs Lab 12/26/14 0359 12/30/14 2325  NA 139 135  K 3.3* 3.2*  CL 105 100*  CO2 28 28  GLUCOSE 133* 124*  BUN 9 21*  CREATININE 0.78 0.89  CALCIUM 7.9* 8.3*  MG 1.9  --   AST  --  32  ALT  --  44  ALKPHOS  --  46  BILITOT  --  0.4   ------------------------------------------------------------------------------------------------------------------  Cardiac Enzymes  Recent Labs Lab 12/25/14 1033  TROPONINI <0.03   ------------------------------------------------------------------------------------------------------------------  RADIOLOGY:  No results found.  EKG:   Orders placed or performed during the hospital encounter of 12/24/14  . EKG 12-Lead  . EKG 12-Lead  . EKG 12-Lead  . EKG 12-Lead  . EKG 12-Lead  . EKG 12-Lead    IMPRESSION AND PLAN:  Principal Problem:   GI bleed - twice a day IV Protonix for now, get a GI consult, seems that his bleeding is likely less  this time around, patient is hemodynamically stable, we will administer gentle fluids for now. We'll monitor him very closely. Active Problems:   Essential hypertension - continue home meds for this, currently controlled here.   BPH (benign prostatic hypertrophy) with urinary retention - is been greater than 48 hours of bladder decompression with Foley in place, we'll remove the Foley and give a voiding trial. We'll continue his home medication of Flomax.   CAD (coronary artery disease) - continue home meds for this, except hold aspirin due to GI bleeding.   IGT (impaired glucose tolerance) - 28 hours previously. Vertigo: Patient is not on any chronic medications for this. Can add sliding scale insulin if needed based on his fingerstick checks.  All the records are reviewed and case discussed with ED provider. Management plans discussed with the patient and/or  family.  DVT PROPHYLAXIS: mechanical only  ADMISSION STATUS: Observation  CODE STATUS: Full  TOTAL TIME TAKING CARE OF THIS PATIENT: 45 minutes.    Shawna Kiener [place] 12/31/2014, 1:44 AM  [facility]  Office  [phone]  CC: Primary care physician; Sallee Lange, NP

## 2014-12-31 NOTE — Progress Notes (Addendum)
[facility] NAME: Shawn Lowery    MR#:  [id number]  DATE OF BIRTH:  [birth year]  SUBJECTIVE:  CHIEF COMPLAINT:   Chief Complaint  Patient presents with  . Rectal Bleeding   No further bleeding here, hb 7.4 this am, wants to have water. REVIEW OF SYSTEMS:  Review of Systems  Constitutional: Negative for fever and chills.  Respiratory: Negative for cough, shortness of breath and wheezing.   Cardiovascular: Negative for chest pain and palpitations.  Gastrointestinal: Positive for blood in stool. Negative for nausea, vomiting, abdominal pain, diarrhea and constipation.  Genitourinary: Negative for dysuria, urgency and frequency.  Musculoskeletal: Negative for myalgias, back pain, joint pain and neck pain.  Neurological: Positive for dizziness and weakness. Negative for sensory change, speech change, focal weakness, seizures and headaches.  Psychiatric/Behavioral: Negative for depression.   DRUG ALLERGIES:  No Known Allergies VITALS:  Blood pressure 136/53, pulse 80, temperature 98.7 F (37.1 C), temperature source Oral, resp. rate 18, height 5\' 11"  (1.803 m), weight 89.858 kg (198 lb 1.6 oz), SpO2 100 %. PHYSICAL EXAMINATION:  Physical Exam  GENERAL:  69 y.o.-year-old patient sitting on the toilet and feels dizzy, has blood all over his back and lower abdomen after the fall.Marland Kitchen  EYES: Pupils equal, round, reactive to light and accommodation. No scleral icterus. Extraocular muscles intact.  HEENT: Head atraumatic, normocephalic. Oropharynx and nasopharynx clear.  NECK:  Supple, no jugular venous distention. No thyroid enlargement, no tenderness.  LUNGS: Normal breath sounds bilaterally, no wheezing, rales,rhonchi or crepitation. No use of accessory muscles of respiration.  CARDIOVASCULAR: S1, S2 normal. No murmurs, rubs, or gallops.  ABDOMEN: Soft, nontender, nondistended. Bowel sounds present. No organomegaly or mass.   EXTREMITIES: No pedal edema, cyanosis, or clubbing.  NEUROLOGIC: Cranial nerves II through XII are intact. Muscle strength 5/5 in all extremities. Sensation intact. Gait not checked.  PSYCHIATRIC: The patient is alert and oriented x 3. Answering questions appropriately. Doesn't remember the fall, no loss of consciousness. SKIN: No obvious rash, lesion, or ulcer.  LABORATORY PANEL:   CBC  Recent Labs Lab 12/31/14 0430  WBC 5.1  HGB 7.4*  HCT 21.8*  PLT 245   ------------------------------------------------------------------------------------------------------------------  Chemistries   Recent Labs Lab 12/26/14 0359 12/30/14 2325 12/31/14 0430  NA 139 135 137  K 3.3* 3.2* 3.3*  CL 105 100* 102  CO2 28 28 30   GLUCOSE 133* 124* 121*  BUN 9 21* 17  CREATININE 0.78 0.89 0.89  CALCIUM 7.9* 8.3* 8.3*  MG 1.9  --   --   AST  --  32  --   ALT  --  44  --   ALKPHOS  --  46  --   BILITOT  --  0.4  --    ------------------------------------------------------------------------------------------------------------------  ASSESSMENT AND PLAN:   69 year old male with past medical history of hypertension, hyperlipidemia, history of diverticulosis, asthma,who readmitted to the hospital due to one episode of rectal bleeding after recent D/C from [facility].  #1 GI bleed-likely a lower GI bleed Considering his recent heavy bleeding requiring transfer to [facility] and [facility]io he's at high risk so will watch him. twice a day IV Protonix for now, await GI consult, monitor him very closely. Hb 7.7 ->7.4  #2 BPH (benign prostatic hypertrophy) with urinary retention - is been greater than 48 hours of bladder decompression with Foley in place, Removed the Foley and giving a voiding trial. continue his home medication of Flomax.  #  3 hypertension- stable on current regimen  #4 Hypokalemia: replete and recheck  #5 asthma-continue Symbicort. Changed to dulera per hospital policy.  #6 CAD (coronary  artery disease) - continue home meds for this, except hold aspirin due to GI bleeding.  * hyperlipidemia-continue Pravachol  * IGT (impaired glucose tolerance) - monitor. Can add sliding scale insulin if needed based on his fingerstick checks.  * DVT prophylaxis- TEDs and SCDs  D/C tele and IVF as he's eating and is hemodynamically stable.    All the records are reviewed and case discussed with Care Management/Social Worker. Management plans discussed with the patient, family and they are in agreement.  CODE STATUS: FULL CODE  TOTAL CRITICAL CARE TIME SPENT IN TAKING CARE OF THIS PATIENT: 25 minutes.   POSSIBLE D/C IN 1 DAYS, DEPENDING ON CLINICAL CONDITION. If no further bleeding or GI intervention planned and if remains hemodynamically stable   [facility], Leomar Westberg M.D on 12/31/2014 at 8:09 AM  Between 7am to 6pm - Pager - [phone]  After 6pm go to [email or web address] - password [email or web address] [facility]  Office  [phone]  CC: Primary care physician; Sallee Lange, NP

## 2014-12-31 NOTE — Progress Notes (Signed)
Patient ID: Shawn Lowery, male   DOB: 04-Nov-1945, 69 y.o.   MRN: [id number]  Subjective: I was asked to see Shawn Lowery in consultation by Dr. Manuella Ghazi for urinary retention.   Shawn Lowery was admitted for a GI bleed about a week ago and developed urinary retention possibly related to IV fluids.  He had a prior history of BPH with BOO and had been on Jalyn under Dr. Vonita Moss care but has been off of that for some time.  He reported no voiding difficulty prior to his recent admission.   He was started on tamsulosin and given an initial voiding trial last Tuesday which he failed.   He was readmitted with bleeding yesterday and given another voiding trial but once again failed with a PVR of 69ml   It had been 86ml initially.  He denies prior GU surgery.   ROS:  Review of Systems  Constitutional: Negative for fever and chills.  Respiratory: Negative for shortness of breath.   Cardiovascular: Positive for leg swelling. Negative for chest pain.  Gastrointestinal: Positive for blood in stool. Negative for abdominal pain.  Genitourinary:       Inability to void  Musculoskeletal: Negative.   All other systems reviewed and are negative.   No Known Allergies  Past Medical History  Diagnosis Date  . Hyperlipemia   . Diverticulosis   . Diverticulitis   . Hypertension   . CAD (coronary artery disease)   . Asthma   . Rectal bleeding   . IGT (impaired glucose tolerance)     Past Surgical History  Procedure Laterality Date  . Hernia repair      Social History   Social History  . Marital Status: Married    Spouse Name: N/A  . Number of Children: N/A  . Years of Education: N/A   Occupational History  . Not on file.   Social History Main Topics  . Smoking status: Former Smoker -- 1.00 packs/day for 15 years  . Smokeless tobacco: Former Systems developer    Quit date: 12/31/1983  . Alcohol Use: No  . Drug Use: No  . Sexual Activity: Not on file   Other Topics Concern  . Not on file   Social History  Narrative    Family History  Problem Relation Age of Onset  . Hypertension Father   . CAD Father   . Stroke Mother    Past, family and social history reviewed.   Anti-infectives: Anti-infectives    None      Current Facility-Administered Medications  Medication Dose Route Frequency Provider Last Rate Last Dose  . 0.9 %  sodium chloride infusion   Intravenous Continuous Lance Coon, MD 75 mL/hr at 12/31/14 [phone]    . acetaminophen (TYLENOL) tablet 650 mg  650 mg Oral Q6H PRN Lance Coon, MD       Or  . acetaminophen (TYLENOL) suppository 650 mg  650 mg Rectal Q6H PRN Lance Coon, MD      . finasteride (PROSCAR) tablet 5 mg  5 mg Oral Daily Irine Seal, MD      . lisinopril (PRINIVIL,ZESTRIL) tablet 20 mg  20 mg Oral Daily Lance Coon, MD   20 mg at 12/31/14 6433   And  . hydrochlorothiazide (HYDRODIURIL) tablet 25 mg  25 mg Oral Daily Lance Coon, MD   25 mg at 12/31/14 2951  . mometasone-formoterol (DULERA) 100-5 MCG/ACT inhaler 2 puff  2 puff Inhalation BID Lance Coon, MD   2 puff at 12/31/14 [phone]  .  ondansetron (ZOFRAN) tablet 4 mg  4 mg Oral Q6H PRN Lance Coon, MD       Or  . ondansetron [facility]) injection 4 mg  4 mg Intravenous Q6H PRN Lance Coon, MD      . pantoprazole (PROTONIX) injection 40 mg  40 mg Intravenous Q12H Lance Coon, MD   40 mg at 12/31/14 1610  . pravastatin (PRAVACHOL) tablet 20 mg  20 mg Oral q1800 Lance Coon, MD      . sodium chloride 0.9 % injection 3 mL  3 mL Intravenous Q12H Lance Coon, MD   3 mL at 12/31/14 0259  . tamsulosin (FLOMAX) capsule 0.4 mg  0.4 mg Oral QPC supper Lance Coon, MD         Objective: Vital signs in last 24 hours: Temp:  [98 F (36.7 C)-98.7 F (37.1 C)] 98.7 F (37.1 C) (09/03 0526) Pulse Rate:  [76-84] 84 (09/03 0915) Resp:  [18-20] 18 (09/03 0526) BP: (116-140)/(53-61) 131/58 mmHg (09/03 0915) SpO2:  [96 %-100 %] 100 % (09/03 0526) Weight:  [87.998 kg (194 lb)-89.858 kg (198 lb 1.6 oz)] 89.858 kg (198  lb 1.6 oz) (09/03 0308)  Intake/Output from previous day:   Intake/Output this shift: Total I/O In: 120 [P.O.:120] Out: 650 [Urine:650]   Physical Exam  Constitutional: He is oriented to person, place, and time and well-developed, well-nourished, and in no distress. No distress.  HENT:  Head: Normocephalic and atraumatic.  Neck: Normal range of motion. Neck supple. No thyromegaly present.  Cardiovascular: Normal rate, regular rhythm and normal heart sounds.   Pulmonary/Chest: Effort normal and breath sounds normal. No respiratory distress.  Abdominal: Soft. Bowel sounds are normal. He exhibits no distension and no mass. There is no tenderness. There is no guarding.  Obese.  No hernias.  Genitourinary:  Nl phallus with a foley at the meatus draining clear urine.   Scrotum unremarkable.   Rectal exam: NST.  Long anal canal without mass, the prostate is enlarged and benign but not fully palpable.   Musculoskeletal: Normal range of motion. He exhibits no edema or tenderness.  Lymphadenopathy:    He has no cervical adenopathy.  Neurological: He is alert and oriented to person, place, and time.  Skin: Skin is warm and dry.  Psychiatric: Mood and affect normal.    Lab Results:   Recent Labs  12/30/14 2325 12/31/14 0430 12/31/14 1047  WBC 5.4 5.1  --   HGB 7.7* 7.4* 7.2*  HCT 22.4* 21.8*  --   PLT 280 245  --    BMET  Recent Labs  12/30/14 2325 12/31/14 0430  NA 135 137  K 3.2* 3.3*  CL 100* 102  CO2 28 30  GLUCOSE 124* 121*  BUN 21* 17  CREATININE 0.89 0.89  CALCIUM 8.3* 8.3*   Results for orders placed or performed during the hospital encounter of 12/28/14  Urine culture     Status: None (Preliminary result)   Collection Time: 12/28/14  5:38 PM  Result Value Ref Range Status   Specimen Description URINE, CLEAN CATCH  Final   Special Requests keflex  Final   Culture INSIGNIFICANT GROWTH  Final   Report Status PENDING  Incomplete    PT/INR No results for  input(s): LABPROT, INR in the last 72 hours. ABG No results for input(s): PHART, HCO3 in the last 72 hours.  Invalid input(s): PCO2, PO2  Studies/Results: No results found.  Case discussed with Dr. Manuella Ghazi Assessment: BPH (benign prostatic hypertrophy) with urinary  retention He has a long history of BPH with BOO and was previously on Jayln under Dr. Ernst Spell.  He now in urinary retention after a GI bleeding and has failed 2 voiding trials.  I have added finasteride to the tamsulosin.  Leg bag for the foley and discharge when cleared by medicine.  He has f/u scheduled on 01/09/15 with [facility] and should keep the foley until then      CC: Dr. Max Sane     Malka So 12/31/2014 [phone]

## 2014-12-31 NOTE — Plan of Care (Signed)
Problem: Discharge Progression Outcomes Goal: Discharge plan in place and appropriate Pt with blood in stool-discharged from [facility] on Wednesday, foley placed outpatient for inability to void Hemoglobin at 7.7 on admission here Goal: Tolerating diet npo Goal: Other Discharge Outcomes/Goals Plan of care progress to goal: Denies pain No active bleeding since admission NPO at present

## 2014-12-31 NOTE — Assessment & Plan Note (Addendum)
He has a long history of BPH with BOO and was previously on Jayln under Dr. Ernst Spell.  He now in urinary retention after a GI bleeding and has failed 2 voiding trials.  I have added finasteride to the tamsulosin.  Leg bag for the foley and discharge when cleared by medicine.  He has f/u scheduled on 01/09/15 with [facility] and should keep the foley until then

## 2014-12-31 NOTE — ED Notes (Signed)
Pt. States this evening after having a bowel movement, pt. States he wiped and noticed bright red blood on toilet paper.  Pt. States recent visit for Lower GI Bleed.  Pt. Reports he was transported to [facility].  Pt. States his blood level was 7.9 when he was discharged from [place] on Monday and it was 7.9 at PCP yesterday.

## 2015-01-01 LAB — GLUCOSE, CAPILLARY
GLUCOSE-CAPILLARY: 126 mg/dL — AB (ref 65–99)
Glucose-Capillary: 113 mg/dL — ABNORMAL HIGH (ref 65–99)

## 2015-01-01 LAB — BASIC METABOLIC PANEL
Anion gap: 7 (ref 5–15)
BUN: 14 mg/dL (ref 6–20)
CHLORIDE: 103 mmol/L (ref 101–111)
CO2: 28 mmol/L (ref 22–32)
CREATININE: 0.93 mg/dL (ref 0.61–1.24)
Calcium: 8.5 mg/dL — ABNORMAL LOW (ref 8.9–10.3)
Glucose, Bld: 119 mg/dL — ABNORMAL HIGH (ref 65–99)
POTASSIUM: 3.8 mmol/L (ref 3.5–5.1)
SODIUM: 138 mmol/L (ref 135–145)

## 2015-01-01 LAB — CBC
HCT: 22.6 % — ABNORMAL LOW (ref 40.0–52.0)
HEMOGLOBIN: 7.6 g/dL — AB (ref 13.0–18.0)
MCH: 29.6 pg (ref 26.0–34.0)
MCHC: 33.4 g/dL (ref 32.0–36.0)
MCV: 88.9 fL (ref 80.0–100.0)
Platelets: 334 10*3/uL (ref 150–440)
RBC: 2.55 MIL/uL — AB (ref 4.40–5.90)
RDW: 14.8 % — ABNORMAL HIGH (ref 11.5–14.5)
WBC: 8.8 10*3/uL (ref 3.8–10.6)

## 2015-01-01 LAB — MAGNESIUM: MAGNESIUM: 2.2 mg/dL (ref 1.7–2.4)

## 2015-01-01 MED ORDER — FINASTERIDE 5 MG PO TABS: 5.0000 mg | ORAL_TABLET | Freq: Every day | ORAL | Status: DC

## 2015-01-01 NOTE — Discharge Instructions (Signed)
Benign Prostatic Hyperplasia An enlarged prostate (benign prostatic hyperplasia) is common in older men. You may experience the following:  Weak urine stream.  Dribbling.  Feeling like the bladder has not emptied completely.  Difficulty starting urination.  Getting up frequently at night to urinate.  Urinating more frequently during the day. HOME CARE INSTRUCTIONS  Monitor your prostatic hyperplasia for any changes. The following actions may help to alleviate any discomfort you are experiencing:  Give yourself time when you urinate.  Stay away from alcohol.  Avoid beverages containing caffeine, such as coffee, tea, and colas, because they can make the problem worse.  Avoid decongestants, antihistamines, and some prescription medicines that can make the problem worse.  Follow up with your health care provider for further treatment as recommended. SEEK MEDICAL CARE IF:  You are experiencing progressive difficulty voiding.  Your urine stream is progressively getting narrower.  You are awaking from sleep with the urge to void more frequently.  You are constantly feeling the need to void.  You experience loss of urine, especially in small amounts. SEEK IMMEDIATE MEDICAL CARE IF:   You develop increased pain with urination or are unable to urinate.  You develop severe abdominal pain, vomiting, a high fever, or fainting.  You develop back pain or blood in your urine. MAKE SURE YOU:   Understand these instructions.  Will watch your condition.  Will get help right away if you are not doing well or get worse. Document Released: 04/15/2005 Document Revised: 12/16/2012 Document Reviewed: 09/15/2012 [facility] Patient Information 2015 [place], [place]. This information is not intended to replace advice given to you by your health care provider. Make sure you discuss any questions you have with your health care provider.   Gastrointestinal Bleeding Gastrointestinal bleeding is  bleeding somewhere along the path that food travels through the body (digestive tract). This path is anywhere between the mouth and the opening of the butt (anus). You may have blood in your throw up (vomit) or in your poop (stools). If there is a lot of bleeding, you may need to stay in the hospital. [place]  Only take medicine as told by your doctor.  Eat foods with fiber such as whole grains, fruits, and vegetables. You can also try eating 1 to 3 prunes a day.  Drink enough fluids to keep your pee (urine) clear or pale yellow. GET HELP RIGHT AWAY IF:   Your bleeding gets worse.  You feel dizzy, weak, or you pass out (faint).  You have bad cramps in your back or belly (abdomen).  You have large blood clumps (clots) in your poop.  Your problems are getting worse. MAKE SURE YOU:   Understand these instructions.  Will watch your condition.  Will get help right away if you are not doing well or get worse. Document Released: 01/23/2008 Document Revised: 04/01/2012 Document Reviewed: 03/25/2011 [facility] Patient Information 2015 [place], [place]. This information is not intended to replace advice given to you by your health care provider. Make sure you discuss any questions you have with your health care provider.

## 2015-01-01 NOTE — Progress Notes (Addendum)
Pt discharged home per MD order. Instructed patient on foley care and use of leg bag. Reviewed discharge packet with follow up appointments, medicines, diet and activity with wife and patient. All questions answered. Patient verbalized understanding. IV removed. Prescription sent to pharmacy.

## 2015-01-01 NOTE — Plan of Care (Signed)
Individualization of Care Pt prefers to be called Shawn Lowery Hx of HLD, diverticulosis, diverticulitis, HTN, CAD, asthma, rectal bleeding.  Admitted for GI bleed.  Problem: Discharge Progression Outcomes Goal: Other Discharge Outcomes/Goals Outcome: Progressing  Plan of care progress to goal: Pain:  Patient without complaints of pain throughout shift. Hemodynamically:  Patient afebrile and VSS this shift.  Hemoglobin at 7.3.  No further reports of bleeding this shift.   BP 128/61 mmHg  Pulse 60  Temp(Src) 97.8 F (36.6 C) (Oral)  Resp 18  Ht 5\' 11"  (1.803 m)  Wt 198 lb 1.6 oz (89.858 kg)  BMI 27.64 kg/m2  SpO2 97% CBC    Component Value Date/Time   WBC 5.1 12/31/2014 0430   RBC 2.45* 12/31/2014 0430   HGB 7.3* 12/31/2014 2303   HCT 21.8* 12/31/2014 0430   PLT 245 12/31/2014 0430   MCV 88.9 12/31/2014 0430   MCH 30.0 12/31/2014 0430   MCHC 33.8 12/31/2014 0430   RDW 14.8* 12/31/2014 0430   LYMPHSABS 1.2 12/27/2014 0713   MONOABS 0.7 12/27/2014 0713   EOSABS 0.1 12/27/2014 0713   BASOSABS 0.0 12/27/2014 0713   Diet:  Patient on regular diet - tolerated well this shift. Activity:  Patient ambulated in room and around unit this shift.  Tolerated well. Discharge:  Plan to discharge patient to home tomorrow possibly with foley.

## 2015-01-01 NOTE — Discharge Summary (Signed)
[facility] NAME: Shawn Lowery    MR#:  [id number]  DATE OF BIRTH:  [birth year]  DATE OF ADMISSION:  12/31/2014 ADMITTING PHYSICIAN: Lance Coon, MD  DATE OF DISCHARGE: 01/01/2015 10:52 AM  PRIMARY CARE PHYSICIAN: Sallee Lange, NP   ADMISSION DIAGNOSIS:  Lower GI bleed [K92.2]  DISCHARGE DIAGNOSIS:  Principal Problem:   GI bleed Active Problems:   Essential hypertension   BPH (benign prostatic hypertrophy) with urinary retention   CAD (coronary artery disease)   IGT (impaired glucose tolerance)   Lower GI bleed  SECONDARY DIAGNOSIS:   Past Medical History  Diagnosis Date  . Hyperlipemia   . Diverticulosis   . Diverticulitis   . Hypertension   . CAD (coronary artery disease)   . Asthma   . Rectal bleeding   . IGT (impaired glucose tolerance)    HOSPITAL COURSE:  Patient is a 69 year old male with a known history of rectal bleeding recently discharged and transferred to [facility] where he was planned to have evaluation as his nuclear bleeding scan was positive here, but when he had angiogram.  His bleeding was only stopped and there was no need for immobilization at that point.  Discharged on 30th of August.  He did okay for 48 hours and had bleeding again on day of this admission.  Please see Dr. Tobey Grim dictated history and physical for further details.  Consultation was obtained with Dr. Eddie Dibbles oh recommended conservative management and monitoring for any further bleeds.  Patient did not have any further bleeding while here in the hospital and he remained hemodynamically stable.  He also had a difficulty with urinary retention and required placement of Foley catheter.  Urology consultation was obtained who recommended adding Proscar which was done.  She was doing well, feeling much better.  September 4 and was discharged home in stable condition as he did not have any further GI bleed while in the  hospital and remained very stable hemodynamically along with tolerating diet. DISCHARGE CONDITIONS:  Stable CONSULTS OBTAINED:  Treatment Team:  Irine Seal, MD Verdie Shire, M.D. DRUG ALLERGIES:  No Known Allergies DISCHARGE MEDICATIONS:   Discharge Medication List as of 01/01/2015 10:01 AM    START taking these medications   Details  finasteride (PROSCAR) 5 MG tablet Take 1 tablet (5 mg total) by mouth daily., Starting 01/01/2015, Until Discontinued, Normal      CONTINUE these medications which have NOT CHANGED   Details  Ascorbic Acid (VITAMIN C) 1000 MG tablet Take 1,000 mg by mouth daily., Until Discontinued, Historical Med    Fluticasone-Salmeterol (ADVAIR) 100-50 MCG/DOSE AEPB Inhale 1 puff into the lungs 2 (two) times daily as needed (for wheezing or shortness of breath). , Until Discontinued, Historical Med    lisinopril-hydrochlorothiazide (PRINZIDE,ZESTORETIC) 20-25 MG per tablet Take 1 tablet by mouth daily., Until Discontinued, Historical Med    lovastatin (MEVACOR) 20 MG tablet Take 20 mg by mouth daily. , Until Discontinued, Historical Med    Multiple Vitamin (MULTIVITAMIN WITH MINERALS) TABS tablet Take 1 tablet by mouth every Monday, Wednesday, and Friday., Until Discontinued, Historical Med    polyethylene glycol (MIRALAX / GLYCOLAX) packet Take 17 g by mouth 3 (three) times daily., Starting 12/27/2014, Until Discontinued, Print    sodium chloride (OCEAN) 0.65 % SOLN nasal spray Place 1 spray into both nostrils as needed for congestion., Until Discontinued, Historical Med    tamsulosin (FLOMAX) 0.4 MG CAPS capsule Take  1 capsule (0.4 mg total) by mouth daily after supper., Starting 12/28/2014, Until Discontinued, Fax       DISCHARGE INSTRUCTIONS:   DIET:  Cardiac diet  DISCHARGE CONDITION:  Good  ACTIVITY:  Activity as tolerated  OXYGEN:  Home Oxygen: No.   Oxygen Delivery: room air  DISCHARGE LOCATION:  home   If you experience worsening of your  admission symptoms, develop shortness of breath, life threatening emergency, suicidal or homicidal thoughts you must seek medical attention immediately by calling 911 or calling your MD immediately  if symptoms less severe.  You Must read complete instructions/literature along with all the possible adverse reactions/side effects for all the Medicines you take and that have been prescribed to you. Take any new Medicines after you have completely understood and accpet all the possible adverse reactions/side effects.   Please note  You were cared for by a hospitalist during your hospital stay. If you have any questions about your discharge medications or the care you received while you were in the hospital after you are discharged, you can call the unit and asked to speak with the hospitalist on call if the hospitalist that took care of you is not available. Once you are discharged, your primary care physician will handle any further medical issues. Please note that NO REFILLS for any discharge medications will be authorized once you are discharged, as it is imperative that you return to your primary care physician (or establish a relationship with a primary care physician if you do not have one) for your aftercare needs so that they can reassess your need for medications and monitor your lab values.    On the day of Discharge: VITAL SIGNS:  Blood pressure 128/61, pulse 60, temperature 97.8 F (36.6 C), temperature source Oral, resp. rate 18, height 5\' 11"  (1.803 m), weight 89.858 kg (198 lb 1.6 oz), SpO2 97 %. PHYSICAL EXAMINATION:  GENERAL:  69 y.o.-year-old patient lying in the bed with no acute distress.  EYES: Pupils equal, round, reactive to light and accommodation. No scleral icterus. Extraocular muscles intact.  HEENT: Head atraumatic, normocephalic. Oropharynx and nasopharynx clear.  NECK:  Supple, no jugular venous distention. No thyroid enlargement, no tenderness.  LUNGS: Normal breath  sounds bilaterally, no wheezing, rales,rhonchi or crepitation. No use of accessory muscles of respiration.  CARDIOVASCULAR: S1, S2 normal. No murmurs, rubs, or gallops.  ABDOMEN: Soft, non-tender, non-distended. Bowel sounds present. No organomegaly or mass.  EXTREMITIES: No pedal edema, cyanosis, or clubbing.  NEUROLOGIC: Cranial nerves II through XII are intact. Muscle strength 5/5 in all extremities. Sensation intact. Gait not checked.  PSYCHIATRIC: The patient is alert and oriented x 3.  SKIN: No obvious rash, lesion, or ulcer.  DATA REVIEW:   CBC  Recent Labs Lab 01/01/15 0454  WBC 8.8  HGB 7.6*  HCT 22.6*  PLT 334    Chemistries   Recent Labs Lab 12/30/14 2325  01/01/15 0454  NA 135  < > 138  K 3.2*  < > 3.8  CL 100*  < > 103  CO2 28  < > 28  GLUCOSE 124*  < > 119*  BUN 21*  < > 14  CREATININE 0.89  < > 0.93  CALCIUM 8.3*  < > 8.5*  MG  --   --  2.2  AST 32  --   --   ALT 44  --   --   ALKPHOS 46  --   --   BILITOT 0.4  --   --   < > =  values in this interval not displayed.   Microbiology Results  Results for orders placed or performed during the hospital encounter of 12/28/14  Urine culture     Status: None   Collection Time: 12/28/14  5:38 PM  Result Value Ref Range Status   Specimen Description URINE, CLEAN CATCH  Final   Special Requests keflex  Final   Culture INSIGNIFICANT GROWTH  Final   Report Status 12/31/2014 FINAL  Final   Management plans discussed with the patient, family and they are in agreement.  CODE STATUS: Full code  TOTAL TIME TAKING CARE OF THIS PATIENT: 55 minutes.    [facility], Alece Koppel M.D on 01/01/2015 at 12:58 PM  Between 7am to 6pm - Pager - [phone]  After 6pm go to [email or web address] - password [email or web address] [facility]  Office  [phone]  CC: Primary care physician; Sallee Lange, NP Hulen Luster, MD Irine Seal, MD

## 2015-01-01 NOTE — Consult Note (Signed)
  Pt being discharged. No further bleeding. Pt to contact our GI office this week to set up outpatient colonoscopy in few weeks. Will sign off. Thanks.

## 2015-01-06 ENCOUNTER — Encounter: Payer: Self-pay | Admitting: *Deleted

## 2015-01-06 DIAGNOSIS — E7800 Pure hypercholesterolemia, unspecified: Secondary | ICD-10-CM | POA: Insufficient documentation

## 2015-01-06 DIAGNOSIS — J45909 Unspecified asthma, uncomplicated: Secondary | ICD-10-CM | POA: Insufficient documentation

## 2015-01-09 ENCOUNTER — Ambulatory Visit (INDEPENDENT_AMBULATORY_CARE_PROVIDER_SITE_OTHER): Payer: 59 | Admitting: Obstetrics and Gynecology

## 2015-01-09 ENCOUNTER — Encounter: Payer: Self-pay | Admitting: Obstetrics and Gynecology

## 2015-01-09 VITALS — BP 144/57 | HR 72 | Resp 16 | Wt 187.4 lb

## 2015-01-09 DIAGNOSIS — R339 Retention of urine, unspecified: Secondary | ICD-10-CM

## 2015-01-09 DIAGNOSIS — N400 Enlarged prostate without lower urinary tract symptoms: Secondary | ICD-10-CM | POA: Diagnosis not present

## 2015-01-09 NOTE — Progress Notes (Signed)
01/09/2015 9:59 AM   Gillian Shields March 22, 1946 [id number]  Referring provider: Sallee Lange, NP [address] [place], [place] [address]  Chief Complaint  Patient presents with  . Urinary Retention    ER follow up    HPI: Mr. Brosky is a 69 year old male presenting today for follow-up after his recent medical admission for GI bleed. While admitted patient developed urinary retention possibly related to administration of IV fluids. He has a prior history of BPH and was previously treated by Dr. Ernst Spell using Sharyne Richters but states that he stopped his medication sometime ago and occasionally used it when necessary when he experienced difficulty voiding. He states that occasionally if he held his urine too long he would have difficulty voiding and would take 1 Jalyn and symptoms would resolve. Patient was seen by Dr. Jeffie Pollock while admitted and was started on finasteride and Flomax. He was discharged with a Foley catheter and presents today for voiding trial. He denies prior GU surgery but states that Dr. Madelin Headings spoke to him about this at one point but patient decided against it because he was doing well on medication.  I-PSS prior to episode of retention 16 most bothersome symptoms urgency and frequency QOL 4  PMH: Past Medical History  Diagnosis Date  . Hyperlipemia   . Diverticulosis   . Diverticulitis   . Hypertension   . CAD (coronary artery disease)   . Asthma   . Rectal bleeding   . IGT (impaired glucose tolerance)     Surgical History: Past Surgical History  Procedure Laterality Date  . Hernia repair      Home Medications:    Medication List       This list is accurate as of: 01/09/15  9:59 AM.  Always use your most recent med list.               finasteride 5 MG tablet  Commonly known as:  PROSCAR  Take 1 tablet (5 mg total) by mouth daily.     Fluticasone-Salmeterol 100-50 MCG/DOSE Aepb  Commonly known as:  ADVAIR  Inhale 1 puff into the lungs 2 (two) times  daily as needed (for wheezing or shortness of breath).     GAVILYTE-N WITH FLAVOR PACK 420 G solution  Generic drug:  polyethylene glycol-electrolytes     lisinopril-hydrochlorothiazide 20-25 MG per tablet  Commonly known as:  PRINZIDE,ZESTORETIC  Take 1 tablet by mouth daily.     lovastatin 20 MG tablet  Commonly known as:  MEVACOR  Take 20 mg by mouth daily.     multivitamin with minerals Tabs tablet  Take 1 tablet by mouth every Monday, Wednesday, and Friday.     nystatin ointment  Commonly known as:  MYCOSTATIN  Apply 1 application topically 2 (two) times daily.     polyethylene glycol packet  Commonly known as:  MIRALAX / GLYCOLAX  Take 17 g by mouth 3 (three) times daily.     sodium chloride 0.65 % Soln nasal spray  Commonly known as:  OCEAN  Place 1 spray into both nostrils as needed for congestion.     tamsulosin 0.4 MG Caps capsule  Commonly known as:  FLOMAX  Take 1 capsule (0.4 mg total) by mouth daily after supper.     vitamin C 1000 MG tablet  Take 1,000 mg by mouth daily.        Allergies: No Known Allergies  Family History: Family History  Problem Relation Age of Onset  .  Hypertension Father   . CAD Father   . Stroke Mother     Social History:  reports that he has quit smoking. He quit smokeless tobacco use about 31 years ago. He reports that he does not drink alcohol or use illicit drugs.  ROS: UROLOGY Frequent Urination?: No Hard to postpone urination?: No Burning/pain with urination?: No Get up at night to urinate?: No Leakage of urine?: No Urine stream starts and stops?: No Trouble starting stream?: No Do you have to strain to urinate?: No Blood in urine?: No Urinary tract infection?: No Sexually transmitted disease?: No Injury to kidneys or bladder?: No Painful intercourse?: No Weak stream?: No Erection problems?: No Penile pain?: No  Gastrointestinal Nausea?: No Vomiting?: No Indigestion/heartburn?: No Diarrhea?:  No Constipation?: No  Constitutional Fever: No Night sweats?: No Weight loss?: No Fatigue?: No  Skin Skin rash/lesions?: No Itching?: No  Eyes Blurred vision?: No Double vision?: No  Ears/Nose/Throat Sore throat?: No Sinus problems?: No  Hematologic/Lymphatic Swollen glands?: No Easy bruising?: No  Cardiovascular Leg swelling?: No Chest pain?: No  Respiratory Cough?: No Shortness of breath?: No  Endocrine Excessive thirst?: No  Musculoskeletal Back pain?: No Joint pain?: No  Neurological Headaches?: No Dizziness?: No  Psychologic Depression?: No Anxiety?: No  Physical Exam: BP 144/57 mmHg  Pulse 72  Resp 16  Wt 187 lb 6.4 oz (85.004 kg)  Constitutional:  Alert and oriented, No acute distress. HEENT: [place] AT, moist mucus membranes.  Trachea midline, no masses. Cardiovascular: No clubbing, cyanosis, or edema. Respiratory: Normal respiratory effort, no increased work of breathing. GI: Abdomen is soft, nontender, nondistended, no abdominal masses GU: No CVA tenderness. DRE deferred until next visit Skin: No rashes, bruises or suspicious lesions. Lymph: No cervical or inguinal adenopathy. Neurologic: Grossly intact, no focal deficits, moving all 4 extremities. Psychiatric: Normal mood and affect.  Laboratory Data: Lab Results  Component Value Date   WBC 8.8 01/01/2015   HGB 7.6* 01/01/2015   HCT 22.6* 01/01/2015   MCV 88.9 01/01/2015   PLT 334 01/01/2015    Lab Results  Component Value Date   CREATININE 0.93 01/01/2015    No results found for: PSA  No results found for: TESTOSTERONE  No results found for: HGBA1C  Urinalysis    Component Value Date/Time   COLORURINE YELLOW 12/28/2014 1738   APPEARANCEUR HAZY* 12/28/2014 1738   LABSPEC 1.025 12/28/2014 1738   PHURINE 5.5 12/28/2014 1738   GLUCOSEU NEGATIVE 12/28/2014 1738   HGBUR NEGATIVE 12/28/2014 1738   BILIRUBINUR NEGATIVE 12/28/2014 1738   KETONESUR NEGATIVE 12/28/2014 1738    PROTEINUR NEGATIVE 12/28/2014 1738   NITRITE NEGATIVE 12/28/2014 1738   LEUKOCYTESUR NEGATIVE 12/28/2014 1738    Pertinent Imaging:  Assessment & Plan:  69 year old man with history of BPH and acute urinary retention status post admission for GI bleed.  1. Urinary Retention- Patient presents for a voiding trial today status post urinary retention during recent admission for GI bleed. History of BPH. Previously was on Jalyn with good results and he was recently restarted on finasteride and Flomax. Foley catheter removed and patient instructed on CIC to be performed if needed.  2. BPH- Patient was a previous patient of Dr. Ernst Spell. I do not have access to his previous PSA levels and patient does not remember if he has any previous history of elevated PSA. Foley catheter present today DRE and PSA levels deferred until next visit after patient undergoes colonoscopy.  Continue daily Flomax and Finasteride.  There are  no diagnoses linked to this encounter.  Return in about 3 months (around 04/10/2015) for recheck BPH and urinary retention; PSA/DRE/I-PSS.  Herbert Moors, [facility] [address], [place], [place] [address] [phone]

## 2015-01-09 NOTE — Progress Notes (Addendum)
Catheter removed today in office. 10 cc of sterile water removed from a 16 Fr catheter. 60 cc of yellow cloudy urine in catheter bag. Patient tolerated well. He was instructed to drink plenty of fluids today and try to urinate on his own. If he is unable to urinate he is to return to office for evaluation this afternoon and possible catheter replacement.     Patient came back this afternoon and was unable to void. I showed him how to do a in and out cath. We used lubricant and placed a 27fr catheter into bladder 600 cc of yellow clear urine came out. Catheter was removed, Patient tolerated well. Samples were given and he is call office to tell us the catheter he likes best so the order can be placed.

## 2015-01-10 NOTE — Discharge Summary (Signed)
[facility] NAME: Shawn Lowery    MR#:  [id number]  DATE OF BIRTH:  1946-04-14  DATE OF ADMISSION:  12/23/2014 ADMITTING PHYSICIAN: Henreitta Leber, MD  DATE OF DISCHARGE: 12/24/2014 12:59 PM  PRIMARY CARE PHYSICIAN: Sallee Lange, NP    ADMISSION DIAGNOSIS:  Acute lower GI bleeding [K92.2]  DISCHARGE DIAGNOSIS:  Active Problems:   GI bleed   Rectal bleed   Acute lower GI bleeding   Poor venous access   SECONDARY DIAGNOSIS:   Past Medical History  Diagnosis Date  . Hyperlipemia   . Diverticulosis   . Diverticulitis   . Hypertension   . CAD (coronary artery disease)   . Asthma   . Rectal bleeding   . IGT (impaired glucose tolerance)     HOSPITAL COURSE:   69 year old male with past medical history of hypertension, hyperlipidemia, history of diverticulosis, asthma,who presents to the hospital due to multiple episodes of rectal bleeding.  #1 GI bleed- active GI bleed, -Initially was stable, admitted to floor, but had significant bloody stools all night with hypotension and syncopal episodes. -Patient was transferred to ICU immediately and a stat bleeding scan was ordered which came back positive. -There is no vascular backup for doing embolization here in this hospital, so patient is being transferred to [place] due to being hypertensive, central line was placed and 2 units of packed RBC has been ordered. The first unit and is being given prior to transfer. -Patient also received several fluid boluses to get his blood pressure stabilized. -History of diverticulosis  #2 Presyncopal episode- since the bleeding started- twice - likely vasovagal, also BP dropping right after the episode, improving with fluids - Also blood transfusion ordered - Bedrest only for now  #3 hypertension-hold antihypertensives given the patient's GI bleed.  #4 hyperlipidemia-continue  Pravachol  #5 asthma-continue Symbicort. Changed to dulera per hospital policy.  Patient being transferred to [facility] for possible angiogram and embolization as bleeding scan is positive.  DISCHARGE CONDITIONS:   critical  CONSULTS OBTAINED:   GI consult  DRUG ALLERGIES:  No Known Allergies  DISCHARGE MEDICATIONS:   Discharge Medication List as of 12/24/2014  1:01 PM    CONTINUE these medications which have NOT CHANGED   Details  Ascorbic Acid (VITAMIN C) 1000 MG tablet Take 1,000 mg by mouth daily., Until Discontinued, Historical Med    Fluticasone-Salmeterol (ADVAIR) 100-50 MCG/DOSE AEPB Inhale 1 puff into the lungs 2 (two) times daily., Until Discontinued, Historical Med    lisinopril-hydrochlorothiazide (PRINZIDE,ZESTORETIC) 20-25 MG per tablet Take 1 tablet by mouth daily., Until Discontinued, Historical Med    lovastatin (MEVACOR) 20 MG tablet Take 20 mg by mouth daily. , Until Discontinued, Historical Med    Multiple Vitamin (MULTIVITAMIN WITH MINERALS) TABS tablet Take 1 tablet by mouth every Monday, Wednesday, and Friday., Until Discontinued, Historical Med    aspirin EC 81 MG tablet Take 81 mg by mouth daily., Until Discontinued, Historical Med         DISCHARGE INSTRUCTIONS:   Patient being transferred to [facility] for possible embolisation.  If you experience worsening of your admission symptoms, develop shortness of breath, life threatening emergency, suicidal or homicidal thoughts you must seek medical attention immediately by calling 911 or calling your MD immediately  if symptoms less severe.  You Must read complete instructions/literature along with all the possible adverse reactions/side effects for all the Medicines you take  and that have been prescribed to you. Take any new Medicines after you have completely understood and accept all the possible adverse reactions/side effects.   Please note  You were cared for by a hospitalist  during your hospital stay. If you have any questions about your discharge medications or the care you received while you were in the hospital after you are discharged, you can call the unit and asked to speak with the hospitalist on call if the hospitalist that took care of you is not available. Once you are discharged, your primary care physician will handle any further medical issues. Please note that NO REFILLS for any discharge medications will be authorized once you are discharged, as it is imperative that you return to your primary care physician (or establish a relationship with a primary care physician if you do not have one) for your aftercare needs so that they can reassess your need for medications and monitor your lab values.    Today   CHIEF COMPLAINT:   Chief Complaint  Patient presents with  . Rectal Bleeding    VITAL SIGNS:  Blood pressure 91/53, pulse 74, temperature 97.6 F (36.4 C), temperature source Rectal, resp. rate 15, height 5\' 11"  (1.803 m), weight 88.27 kg (194 lb 9.6 oz), SpO2 96 %.  I/O:  No intake or output data in the 24 hours ending 01/10/15 1327  PHYSICAL EXAMINATION:   Physical Exam  GENERAL: 69 y.o.-year-old patient sitting on the toilet and feels dizzy, has blood all over his back and lower abdomen after the fall.Marland Kitchen  EYES: Pupils equal, round, reactive to light and accommodation. No scleral icterus. Extraocular muscles intact.  HEENT: Head atraumatic, normocephalic. Oropharynx and nasopharynx clear.  NECK: Supple, no jugular venous distention. No thyroid enlargement, no tenderness.  LUNGS: Normal breath sounds bilaterally, no wheezing, rales,rhonchi or crepitation. No use of accessory muscles of respiration.  CARDIOVASCULAR: S1, S2 normal. No murmurs, rubs, or gallops.  ABDOMEN: Soft, nontender, nondistended. Bowel sounds present. No organomegaly or mass.  EXTREMITIES: No pedal edema, cyanosis, or clubbing.  NEUROLOGIC: Cranial nerves II  through XII are intact. Muscle strength 5/5 in all extremities. Sensation intact. Gait not checked.  PSYCHIATRIC: The patient is alert and oriented x 3. Answering questions appropriately. Doesn't remember the fall, no loss of consciousness. SKIN: No obvious rash, lesion, or ulcer.   DATA REVIEW:   CBC No results for input(s): WBC, HGB, HCT, PLT in the last 168 hours.  Chemistries  No results for input(s): NA, K, CL, CO2, GLUCOSE, BUN, CREATININE, CALCIUM, MG, AST, ALT, ALKPHOS, BILITOT in the last 168 hours.  Invalid input(s): GFRCGP  Cardiac Enzymes No results for input(s): TROPONINI in the last 168 hours.  Microbiology Results  Results for orders placed or performed during the hospital encounter of 12/23/14  MRSA PCR Screening     Status: None   Collection Time: 12/24/14  8:36 AM  Result Value Ref Range Status   MRSA by PCR NEGATIVE NEGATIVE Final    Comment:        The GeneXpert MRSA Assay (FDA approved for NASAL specimens only), is one component of a comprehensive MRSA colonization surveillance program. It is not intended to diagnose MRSA infection nor to guide or monitor treatment for MRSA infections.     RADIOLOGY:  No results found.  EKG:   Orders placed or performed during the hospital encounter of 12/23/14  . EKG 12-Lead  . EKG 12-Lead  . EKG      Management  plans discussed with the patient, family and they are in agreement.  CODE STATUS: FULL CODE  TOTAL CRITICAL CARE TIME SPENT IN TAKING CARE OF THIS PATIENT: 85 minutes.    Gladstone Lighter M.D on 01/10/2015 at 1:27 PM  Between 7am to 6pm - Pager - [phone]  After 6pm go to [email or web address] - password [email or web address] [facility]  Office  [phone]  CC: Primary care physician; Sallee Lange, NP

## 2015-01-11 ENCOUNTER — Other Ambulatory Visit: Payer: Self-pay

## 2015-01-11 DIAGNOSIS — N400 Enlarged prostate without lower urinary tract symptoms: Secondary | ICD-10-CM

## 2015-01-11 MED ORDER — TAMSULOSIN HCL 0.4 MG PO CAPS: 0.4000 mg | ORAL_CAPSULE | Freq: Every day | ORAL | Status: DC

## 2015-01-11 MED ORDER — FINASTERIDE 5 MG PO TABS
5.0000 mg | ORAL_TABLET | Freq: Every day | ORAL | Status: DC
Start: 2015-01-11 — End: 2015-02-20

## 2015-01-11 NOTE — Progress Notes (Signed)
Pt called stating medication was not sent to pharmacy. Per Lindsay's last note pt should be taking finasteride and flomax. Both medications were called into pt pharmacy and pt made aware.

## 2015-02-09 ENCOUNTER — Encounter: Payer: Self-pay | Admitting: *Deleted

## 2015-02-10 ENCOUNTER — Encounter
Admission: RE | Disposition: A | Discharge status: Home Health | Payer: Self-pay | Source: Ambulatory Visit | Attending: Gastroenterology

## 2015-02-10 ENCOUNTER — Ambulatory Visit: Payer: 59 | Admitting: Anesthesiology

## 2015-02-10 ENCOUNTER — Ambulatory Visit
Admission: RE | Admit: 2015-02-10 | Discharge: 2015-02-10 | Disposition: A | Discharge status: Home Health | Payer: 59 | Source: Ambulatory Visit | Attending: Gastroenterology | Admitting: Gastroenterology

## 2015-02-10 DIAGNOSIS — M1990 Unspecified osteoarthritis, unspecified site: Secondary | ICD-10-CM | POA: Diagnosis not present

## 2015-02-10 DIAGNOSIS — E785 Hyperlipidemia, unspecified: Secondary | ICD-10-CM | POA: Insufficient documentation

## 2015-02-10 DIAGNOSIS — I2510 Atherosclerotic heart disease of native coronary artery without angina pectoris: Secondary | ICD-10-CM | POA: Insufficient documentation

## 2015-02-10 DIAGNOSIS — K625 Hemorrhage of anus and rectum: Secondary | ICD-10-CM | POA: Diagnosis present

## 2015-02-10 DIAGNOSIS — R0981 Nasal congestion: Secondary | ICD-10-CM | POA: Insufficient documentation

## 2015-02-10 DIAGNOSIS — K5730 Diverticulosis of large intestine without perforation or abscess without bleeding: Secondary | ICD-10-CM | POA: Diagnosis not present

## 2015-02-10 DIAGNOSIS — Z79899 Other long term (current) drug therapy: Secondary | ICD-10-CM | POA: Diagnosis not present

## 2015-02-10 DIAGNOSIS — Z7951 Long term (current) use of inhaled steroids: Secondary | ICD-10-CM | POA: Insufficient documentation

## 2015-02-10 DIAGNOSIS — I10 Essential (primary) hypertension: Secondary | ICD-10-CM | POA: Insufficient documentation

## 2015-02-10 DIAGNOSIS — J45909 Unspecified asthma, uncomplicated: Secondary | ICD-10-CM | POA: Insufficient documentation

## 2015-02-10 DIAGNOSIS — Z87891 Personal history of nicotine dependence: Secondary | ICD-10-CM | POA: Insufficient documentation

## 2015-02-10 HISTORY — DX: Unspecified osteoarthritis, unspecified site: M19.90

## 2015-02-10 HISTORY — PX: COLONOSCOPY WITH PROPOFOL: SHX5780

## 2015-02-10 SURGERY — COLONOSCOPY WITH PROPOFOL
Anesthesia: General

## 2015-02-10 MED ORDER — EPHEDRINE SULFATE 50 MG/ML IJ SOLN
INTRAMUSCULAR | Status: DC | PRN
  Administered 2015-02-10: 10 mg via INTRAVENOUS

## 2015-02-10 MED ORDER — LIDOCAINE HCL (CARDIAC) 20 MG/ML IV SOLN
INTRAVENOUS | Status: DC | PRN
  Administered 2015-02-10: 50 mg via INTRAVENOUS

## 2015-02-10 MED ORDER — SODIUM CHLORIDE 0.9 % IV SOLN
INTRAVENOUS | Status: DC
  Administered 2015-02-10: 1000 mL via INTRAVENOUS

## 2015-02-10 MED ORDER — PROPOFOL 500 MG/50ML IV EMUL
INTRAVENOUS | Status: DC | PRN
  Administered 2015-02-10: 120 ug/kg/min via INTRAVENOUS

## 2015-02-10 MED ORDER — MIDAZOLAM HCL 5 MG/5ML IJ SOLN
INTRAMUSCULAR | Status: DC | PRN
  Administered 2015-02-10: 1 mg via INTRAVENOUS

## 2015-02-10 MED ORDER — PROPOFOL 10 MG/ML IV BOLUS
INTRAVENOUS | Status: DC | PRN
  Administered 2015-02-10: 70 mg via INTRAVENOUS

## 2015-02-10 NOTE — Op Note (Signed)
[facility] Gastroenterology Patient Name: Shawn Lowery Procedure Date: 02/10/2015 11:28 AM MRN: [id number] Account #: [id number] Date of Birth: 10/13/1945 Admit Type: Outpatient Age: 69 Room: [facility] ENDO ROOM 3 Gender: Male Note Status: Finalized Procedure:         Colonoscopy Indications:       Rectal bleeding Providers:         Lollie Sails, MD Referring MD:      Juluis Rainier (Referring MD) Medicines:         Monitored Anesthesia Care Complications:     No immediate complications. Procedure:         Pre-Anesthesia Assessment:                    - ASA Grade Assessment: III - A patient with severe                     systemic disease.                    After obtaining informed consent, the colonoscope was                     passed under direct vision. Throughout the procedure, the                     patient's blood pressure, pulse, and oxygen saturations                     were monitored continuously. The Colonoscope was                     introduced through the anus with the intention of                     advancing to the cecum. The scope was advanced to the                     sigmoid colon before the procedure was aborted.                     Medications were given. The colonoscopy was unusually                     difficult due to poor bowel prep with stool present. The                     quality of the bowel preparation was poor. Findings:      Many small and large-mouthed diverticula were found in the sigmoid colon. Impression:        - Preparation of the colon was poor.                    - Diverticulosis in the sigmoid colon.                    - No specimens collected. Recommendation:    - repeat prep and reschedule. Procedure Code(s): --- Professional ---                    [phone], Sigmoidoscopy, flexible; diagnostic, including                     collection of specimen(s) by brushing or washing, when  performed  (separate procedure) Diagnosis Code(s): --- Professional ---                    569.3, Hemorrhage of rectum and anus                    562.10, Diverticulosis of colon (without mention of                     hemorrhage) CPT copyright 2014 American Medical Association. All rights reserved. The codes documented in this report are preliminary and upon coder review may  be revised to meet current compliance requirements. Lollie Sails, MD 02/10/2015 11:42:25 AM This report has been signed electronically. Number of Addenda: 0 Note Initiated On: 02/10/2015 11:28 AM Total Procedure Duration: 0 hours 4 minutes 28 seconds       [facility]

## 2015-02-10 NOTE — Anesthesia Postprocedure Evaluation (Signed)
  Anesthesia Post-op Note  Patient: Shawn Lowery  Procedure(s) Performed: Procedure(s): COLONOSCOPY WITH PROPOFOL (N/A)  Anesthesia type:General  Patient location: PACU  Post pain: Pain level controlled  Post assessment: Post-op Vital signs reviewed, Patient's Cardiovascular Status Stable, Respiratory Function Stable, Patent Airway and No signs of Nausea or vomiting  Post vital signs: Reviewed and stable  Last Vitals:  Filed Vitals:   02/10/15 1210  BP: 131/72  Pulse: 85  Temp:   Resp: 20    Level of consciousness: awake, alert  and patient cooperative  Complications: No apparent anesthesia complications

## 2015-02-10 NOTE — H&P (Signed)
Outpatient short stay form Pre-procedure 02/10/2015 11:24 AM Shawn Sails MD  Primary Physician: Mercy Riding NP  Reason for visit:  Colonoscopy  History of present illness:  Patient is a 69 year old male with a history of rectal bleeding. He was hospitalized in late August rectal bleeding. At that time he had a leading scan which was positive however when vascular injury was done there is no evidence of ongoing bleeding so there was no intervention. He was transfused several units of packed cells at that time. He has had no repeat bleeding since he came home from the hospital. Is been no abdominal pain. Last colonoscopy was in 2009 with finding of diverticulosis.  He tolerated his prep well. He takes no aspirin or blood thinning products.    Current facility-administered medications:  .  0.9 %  sodium chloride infusion, , Intravenous, Continuous, Shawn Sails, MD, Last Rate: 20 mL/hr at 02/10/15 1106, 1,000 mL at 02/10/15 1106  Prescriptions prior to admission  Medication Sig Dispense Refill Last Dose  . lisinopril-hydrochlorothiazide (PRINZIDE,ZESTORETIC) 20-25 MG per tablet Take 1 tablet by mouth daily.   02/10/2015 at 0600  . tamsulosin (FLOMAX) 0.4 MG CAPS capsule Take 1 capsule (0.4 mg total) by mouth daily after supper. 30 capsule 0 02/10/2015 at 0600  . Ascorbic Acid (VITAMIN C) 1000 MG tablet Take 1,000 mg by mouth daily.   Taking  . finasteride (PROSCAR) 5 MG tablet Take 1 tablet (5 mg total) by mouth daily. 30 tablet 0   . Fluticasone-Salmeterol (ADVAIR) 100-50 MCG/DOSE AEPB Inhale 1 puff into the lungs 2 (two) times daily as needed (for wheezing or shortness of breath).    Taking  . GAVILYTE-N WITH FLAVOR PACK 420 G solution      . lovastatin (MEVACOR) 20 MG tablet Take 20 mg by mouth daily.    Taking  . Multiple Vitamin (MULTIVITAMIN WITH MINERALS) TABS tablet Take 1 tablet by mouth every Monday, Wednesday, and Friday.   Taking  . nystatin ointment (MYCOSTATIN) Apply  1 application topically 2 (two) times daily.     . polyethylene glycol (MIRALAX / GLYCOLAX) packet Take 17 g by mouth 3 (three) times daily. 14 each 0 Taking  . sodium chloride (OCEAN) 0.65 % SOLN nasal spray Place 1 spray into both nostrils as needed for congestion.   Taking     No Known Allergies   Past Medical History  Diagnosis Date  . Hyperlipemia   . Diverticulosis   . Diverticulitis   . Hypertension   . CAD (coronary artery disease)   . Asthma   . Rectal bleeding   . IGT (impaired glucose tolerance)   . Arthritis     Review of systems:      Physical Exam    Heart and lungs: Regular rate and rhythm without rub or gallop, lungs are bilaterally clear    HEENT: Normocephalic atraumatic eyes are anicteric    Other:     Pertinant exam for procedure: Soft nontender nondistended bowel sounds positive normoactive    Planned proceedures: Colonoscopy and indicated procedures I have discussed the risks benefits and complications of procedures to include not limited to bleeding, infection, perforation and the risk of sedation and the patient wishes to proceed.    Shawn Sails, MD Gastroenterology 02/10/2015  11:24 AM

## 2015-02-10 NOTE — Anesthesia Preprocedure Evaluation (Signed)
Anesthesia Evaluation  Patient identified by MRN, date of birth, ID band Patient awake    Reviewed: Allergy & Precautions, NPO status , Patient's Chart, lab work & pertinent test results  History of Anesthesia Complications Negative for: history of anesthetic complications  Airway Mallampati: III       Dental  (+) Chipped   Pulmonary asthma , former smoker,           Cardiovascular hypertension, Pt. on medications + CAD       Neuro/Psych negative neurological ROS     GI/Hepatic negative GI ROS, Neg liver ROS,   Endo/Other  negative endocrine ROS  Renal/GU negative Renal ROS     Musculoskeletal  (+) Arthritis , Osteoarthritis,    Abdominal   Peds  Hematology negative hematology ROS (+)   Anesthesia Other Findings   Reproductive/Obstetrics                             Anesthesia Physical Anesthesia Plan  ASA: III  Anesthesia Plan: General   Post-op Pain Management:    Induction: Intravenous  Airway Management Planned:   Additional Equipment:   Intra-op Plan:   Post-operative Plan:   Informed Consent: I have reviewed the patients History and Physical, chart, labs and discussed the procedure including the risks, benefits and alternatives for the proposed anesthesia with the patient or authorized representative who has indicated his/her understanding and acceptance.     Plan Discussed with:   Anesthesia Plan Comments:         Anesthesia Quick Evaluation

## 2015-02-10 NOTE — Transfer of Care (Signed)
Immediate Anesthesia Transfer of Care Note  Patient: Shawn Lowery  Procedure(s) Performed: Procedure(s): COLONOSCOPY WITH PROPOFOL (N/A)  Patient Location: Endoscopy Unit  Anesthesia Type:General  Level of Consciousness: awake  Airway & Oxygen Therapy: Patient Spontanous Breathing and Patient connected to nasal cannula oxygen  Post-op Assessment: Report given to RN  Post vital signs: Reviewed  Last Vitals:  Filed Vitals:   02/10/15 1144  BP: 119/42  Pulse:   Temp: 36.1 C  Resp: 14    Complications: No apparent anesthesia complications

## 2015-02-13 ENCOUNTER — Encounter: Payer: Self-pay | Admitting: *Deleted

## 2015-02-13 ENCOUNTER — Ambulatory Visit: Payer: 59 | Admitting: Anesthesiology

## 2015-02-13 ENCOUNTER — Encounter
Admission: RE | Disposition: A | Discharge status: Home / Self Care | Payer: Self-pay | Source: Ambulatory Visit | Attending: Gastroenterology

## 2015-02-13 ENCOUNTER — Ambulatory Visit
Admission: RE | Admit: 2015-02-13 | Discharge: 2015-02-13 | Disposition: A | Discharge status: Home / Self Care | Payer: 59 | Source: Ambulatory Visit | Attending: Gastroenterology | Admitting: Gastroenterology

## 2015-02-13 DIAGNOSIS — K648 Other hemorrhoids: Secondary | ICD-10-CM | POA: Diagnosis not present

## 2015-02-13 DIAGNOSIS — I2510 Atherosclerotic heart disease of native coronary artery without angina pectoris: Secondary | ICD-10-CM | POA: Insufficient documentation

## 2015-02-13 DIAGNOSIS — Z87891 Personal history of nicotine dependence: Secondary | ICD-10-CM | POA: Insufficient documentation

## 2015-02-13 DIAGNOSIS — K921 Melena: Secondary | ICD-10-CM | POA: Diagnosis present

## 2015-02-13 DIAGNOSIS — E785 Hyperlipidemia, unspecified: Secondary | ICD-10-CM | POA: Insufficient documentation

## 2015-02-13 DIAGNOSIS — M1990 Unspecified osteoarthritis, unspecified site: Secondary | ICD-10-CM | POA: Diagnosis not present

## 2015-02-13 DIAGNOSIS — I10 Essential (primary) hypertension: Secondary | ICD-10-CM | POA: Insufficient documentation

## 2015-02-13 DIAGNOSIS — J45909 Unspecified asthma, uncomplicated: Secondary | ICD-10-CM | POA: Insufficient documentation

## 2015-02-13 DIAGNOSIS — K5730 Diverticulosis of large intestine without perforation or abscess without bleeding: Secondary | ICD-10-CM | POA: Insufficient documentation

## 2015-02-13 HISTORY — PX: COLONOSCOPY WITH PROPOFOL: SHX5780

## 2015-02-13 SURGERY — COLONOSCOPY WITH PROPOFOL
Anesthesia: General

## 2015-02-13 MED ORDER — SODIUM CHLORIDE 0.9 % IV SOLN: INTRAVENOUS | Status: DC

## 2015-02-13 MED ORDER — EPHEDRINE SULFATE 50 MG/ML IJ SOLN
INTRAMUSCULAR | Status: DC | PRN
  Administered 2015-02-13: 10 mg via INTRAVENOUS

## 2015-02-13 MED ORDER — GLYCOPYRROLATE 0.2 MG/ML IJ SOLN
INTRAMUSCULAR | Status: DC | PRN
  Administered 2015-02-13: .2 mg via INTRAVENOUS

## 2015-02-13 MED ORDER — PROPOFOL 10 MG/ML IV BOLUS
INTRAVENOUS | Status: DC | PRN
  Administered 2015-02-13: 30 mg via INTRAVENOUS
  Administered 2015-02-13 (×7): 20 mg via INTRAVENOUS

## 2015-02-13 MED ORDER — SODIUM CHLORIDE 0.9 % IV SOLN
INTRAVENOUS | Status: DC
  Administered 2015-02-13: 11:00:00 via INTRAVENOUS

## 2015-02-13 NOTE — H&P (Signed)
Outpatient short stay form Pre-procedure 02/13/2015 11:39 AM Lollie Sails MD  Primary Physician: Mercy Riding, NP  Reason for visit:  Colonoscopy  History of present illness:  Patient is a 69 year old male with a personal history of some rectal bleeding. He is hospitalized in late August with this rectal bleeding. At that time a bleeding scan was positive however when vascular surgery did a angiogram there is no evidence of active loss so there was no intervention otherwise. He had been transfused several units of packed cells at the time. There's been no repeat bleeding since that time. There is no abdominal pain. His last colonoscopy was in 2000 time the finding of diverticulosis. He did come in to [facility] on 04/12/2015 for a colonoscopy however the prep was not good at that time and required repeat prep.     Current facility-administered medications:  .  0.9 %  sodium chloride infusion, , Intravenous, Continuous, Lollie Sails, MD, Last Rate: 20 mL/hr at 02/13/15 1106 .  0.9 %  sodium chloride infusion, , Intravenous, Continuous, Lollie Sails, MD  Prescriptions prior to admission  Medication Sig Dispense Refill Last Dose  . Ascorbic Acid (VITAMIN C) 1000 MG tablet Take 1,000 mg by mouth daily.   Past Week at Unknown time  . finasteride (PROSCAR) 5 MG tablet Take 1 tablet (5 mg total) by mouth daily. 30 tablet 0 02/13/2015 at Unknown time  . Fluticasone-Salmeterol (ADVAIR) 100-50 MCG/DOSE AEPB Inhale 1 puff into the lungs 2 (two) times daily as needed (for wheezing or shortness of breath).    02/12/2015 at Unknown time  . GAVILYTE-N WITH FLAVOR PACK 420 G solution    02/12/2015 at Unknown time  . lisinopril-hydrochlorothiazide (PRINZIDE,ZESTORETIC) 20-25 MG per tablet Take 1 tablet by mouth daily.   02/13/2015 at Unknown time  . lovastatin (MEVACOR) 20 MG tablet Take 20 mg by mouth daily.    02/12/2015 at Unknown time  . Multiple Vitamin (MULTIVITAMIN  WITH MINERALS) TABS tablet Take 1 tablet by mouth every Monday, Wednesday, and Friday.   Past Week at Unknown time  . nystatin ointment (MYCOSTATIN) Apply 1 application topically 2 (two) times daily.   02/12/2015 at Unknown time  . sodium chloride (OCEAN) 0.65 % SOLN nasal spray Place 1 spray into both nostrils as needed for congestion.   Past Week at Unknown time  . tamsulosin (FLOMAX) 0.4 MG CAPS capsule Take 1 capsule (0.4 mg total) by mouth daily after supper. 30 capsule 0 02/13/2015 at Unknown time  . polyethylene glycol (MIRALAX / GLYCOLAX) packet Take 17 g by mouth 3 (three) times daily. (Patient not taking: Reported on 02/13/2015) 14 each 0 Not Taking at Unknown time     No Known Allergies   Past Medical History  Diagnosis Date  . Hyperlipemia   . Diverticulosis   . Diverticulitis   . Hypertension   . CAD (coronary artery disease)   . Asthma   . Rectal bleeding   . IGT (impaired glucose tolerance)   . Arthritis     Review of systems:      Physical Exam    Heart and lungs: Regular rate and rhythm without rub or gallop, lungs are bilaterally clear    HEENT: Normal cephalic atraumatic eyes are anicteric    Other:     Pertinant exam for procedure: Soft nontender nondistended bowel sounds positive normoactive    Planned proceedures: Colonoscopy and indicated procedures I have discussed the risks benefits and complications of  procedures to include not limited to bleeding, infection, perforation and the risk of sedation and the patient wishes to proceed.    Lollie Sails, MD Gastroenterology 02/13/2015  11:39 AM

## 2015-02-13 NOTE — Anesthesia Preprocedure Evaluation (Signed)
Anesthesia Evaluation  Patient identified by MRN, date of birth, ID band Patient awake    Reviewed: Allergy & Precautions, H&P , NPO status , Patient's Chart, lab work & pertinent test results, reviewed documented beta blocker date and time   Airway Mallampati: II  TM Distance: >3 FB Neck ROM: full    Dental no notable dental hx.    Pulmonary neg pulmonary ROS, asthma , former smoker,    Pulmonary exam normal breath sounds clear to auscultation       Cardiovascular Exercise Tolerance: Good hypertension, + CAD  negative cardio ROS   Rhythm:regular Rate:Normal     Neuro/Psych negative neurological ROS  negative psych ROS   GI/Hepatic negative GI ROS, Neg liver ROS,   Endo/Other  negative endocrine ROS  Renal/GU negative Renal ROS  negative genitourinary   Musculoskeletal   Abdominal   Peds  Hematology negative hematology ROS (+)   Anesthesia Other Findings   Reproductive/Obstetrics negative OB ROS                             Anesthesia Physical Anesthesia Plan  ASA: III  Anesthesia Plan: General   Post-op Pain Management:    Induction:   Airway Management Planned:   Additional Equipment:   Intra-op Plan:   Post-operative Plan:   Informed Consent: I have reviewed the patients History and Physical, chart, labs and discussed the procedure including the risks, benefits and alternatives for the proposed anesthesia with the patient or authorized representative who has indicated his/her understanding and acceptance.   Dental Advisory Given  Plan Discussed with: CRNA  Anesthesia Plan Comments:         Anesthesia Quick Evaluation

## 2015-02-13 NOTE — Op Note (Signed)
[facility] Patient Name: Shawn Lowery Procedure Date: 02/13/2015 11:49 AM MRN: [id number] Account #: [id number] Date of Birth: July 15, 1945 Admit Type: Outpatient Age: 69 Room: [facility] ENDO ROOM 3 Gender: Male Note Status: Finalized Procedure:         Colonoscopy Indications:       Hematochezia Providers:         Lollie Sails, MD Referring MD:      Lollie Sails, MD (Referring MD) Medicines:         Monitored Anesthesia Care Complications:     No immediate complications. Procedure:         Pre-Anesthesia Assessment:                    - ASA Grade Assessment: III - A patient with severe                     systemic disease.                    After obtaining informed consent, the colonoscope was                     passed under direct vision. Throughout the procedure, the                     patient's blood pressure, pulse, and oxygen saturations                     were monitored continuously. The Olympus PCF-H180AL                     colonoscope ( S#: [id number] ) was introduced through the                     anus and advanced to the the cecum, identified by                     appendiceal orifice and ileocecal valve. The colonoscopy                     was performed with moderate difficulty due to multiple                     diverticula in the colon and a tortuous colon. Successful                     completion of the procedure was aided by changing the                     patient to a supine position, changing the patient to a                     prone position and using manual pressure. The patient                     tolerated the procedure. Findings:      Many small and large-mouthed diverticula were found in the sigmoid       colon, in the descending colon, in the transverse colon and in the       ascending colon.      Non-bleeding internal hemorrhoids were found during anoscopy. The       hemorrhoids were small.      No  additional abnormalities were found on retroflexion.  Impression:        - Diverticulosis in the sigmoid colon, in the descending                     colon, in the transverse colon and in the ascending colon.                    - Non-bleeding internal hemorrhoids.                    - No specimens collected. Recommendation:    - Discharge patient to home. Procedure Code(s): --- Professional ---                    [phone], Colonoscopy, flexible; diagnostic, including                     collection of specimen(s) by brushing or washing, when                     performed (separate procedure) Diagnosis Code(s): --- Professional ---                    455.0, Internal hemorrhoids without mention of complication                    578.1, Blood in stool                    562.10, Diverticulosis of colon (without mention of                     hemorrhage) CPT copyright 2014 [employer]. All rights reserved. The codes documented in this report are preliminary and upon coder review may  be revised to meet current compliance requirements. Lollie Sails, MD 02/13/2015 12:26:10 PM This report has been signed electronically. Number of Addenda: 0 Note Initiated On: 02/13/2015 11:49 AM Scope Withdrawal Time: 0 hours 9 minutes 33 seconds  Total Procedure Duration: 0 hours 24 minutes 35 seconds       [facility]

## 2015-02-13 NOTE — Transfer of Care (Signed)
Immediate Anesthesia Transfer of Care Note  Patient: Shawn Lowery  Procedure(s) Performed: Procedure(s): COLONOSCOPY WITH PROPOFOL (N/A)  Patient Location: PACU and Endoscopy Unit  Anesthesia Type:General  Level of Consciousness: alert   Airway & Oxygen Therapy: Patient Spontanous Breathing and Patient connected to nasal cannula oxygen  Post-op Assessment: Report given to RN and Post -op Vital signs reviewed and stable  Post vital signs: stable  Last Vitals:  Filed Vitals:   02/13/15 1048  BP: 147/64  Pulse: 60  Temp: 36.1 C  Resp: 16    Complications: No apparent anesthesia complications

## 2015-02-13 NOTE — Anesthesia Postprocedure Evaluation (Signed)
  Anesthesia Post-op Note  Patient: Shawn Lowery  Procedure(s) Performed: Procedure(s): COLONOSCOPY WITH PROPOFOL (N/A)  Anesthesia type:General  Patient location: PACU  Post pain: Pain level controlled  Post assessment: Post-op Vital signs reviewed, Patient's Cardiovascular Status Stable, Respiratory Function Stable, Patent Airway and No signs of Nausea or vomiting  Post vital signs: Reviewed and stable  Last Vitals:  Filed Vitals:   02/13/15 1048  BP: 147/64  Pulse: 60  Temp: 36.1 C  Resp: 16    Level of consciousness: awake, alert  and patient cooperative  Complications: No apparent anesthesia complications

## 2015-02-15 ENCOUNTER — Encounter: Payer: Self-pay | Admitting: Gastroenterology

## 2015-02-20 ENCOUNTER — Other Ambulatory Visit: Payer: Self-pay | Admitting: Obstetrics and Gynecology

## 2015-02-20 DIAGNOSIS — N400 Enlarged prostate without lower urinary tract symptoms: Secondary | ICD-10-CM

## 2015-04-14 ENCOUNTER — Ambulatory Visit: Payer: Medicare Other | Admitting: Obstetrics and Gynecology

## 2015-05-12 DIAGNOSIS — H2513 Age-related nuclear cataract, bilateral: Secondary | ICD-10-CM | POA: Diagnosis not present

## 2015-05-26 DIAGNOSIS — J3089 Other allergic rhinitis: Secondary | ICD-10-CM | POA: Diagnosis not present

## 2015-05-26 DIAGNOSIS — M791 Myalgia: Secondary | ICD-10-CM | POA: Diagnosis not present

## 2015-06-30 DIAGNOSIS — I10 Essential (primary) hypertension: Secondary | ICD-10-CM | POA: Diagnosis not present

## 2015-06-30 DIAGNOSIS — E7800 Pure hypercholesterolemia, unspecified: Secondary | ICD-10-CM | POA: Diagnosis not present

## 2015-06-30 DIAGNOSIS — I480 Paroxysmal atrial fibrillation: Secondary | ICD-10-CM | POA: Diagnosis not present

## 2015-06-30 DIAGNOSIS — I2510 Atherosclerotic heart disease of native coronary artery without angina pectoris: Secondary | ICD-10-CM | POA: Diagnosis not present

## 2015-07-01 ENCOUNTER — Emergency Department: Payer: 59

## 2015-07-01 ENCOUNTER — Ambulatory Visit
Admission: EM | Admit: 2015-07-01 | Discharge: 2015-07-01 | Disposition: A | Discharge status: Home / Self Care | Payer: 59 | Attending: Family Medicine | Admitting: Family Medicine

## 2015-07-01 ENCOUNTER — Emergency Department
Admission: EM | Admit: 2015-07-01 | Discharge: 2015-07-01 | Disposition: A | Discharge status: Home / Self Care | Payer: 59 | Attending: Emergency Medicine | Admitting: Emergency Medicine

## 2015-07-01 ENCOUNTER — Encounter: Payer: Self-pay | Admitting: Emergency Medicine

## 2015-07-01 DIAGNOSIS — I10 Essential (primary) hypertension: Secondary | ICD-10-CM | POA: Diagnosis not present

## 2015-07-01 DIAGNOSIS — Z79899 Other long term (current) drug therapy: Secondary | ICD-10-CM | POA: Diagnosis not present

## 2015-07-01 DIAGNOSIS — S61219A Laceration without foreign body of unspecified finger without damage to nail, initial encounter: Secondary | ICD-10-CM

## 2015-07-01 DIAGNOSIS — W230XXA Caught, crushed, jammed, or pinched between moving objects, initial encounter: Secondary | ICD-10-CM | POA: Diagnosis not present

## 2015-07-01 DIAGNOSIS — Y998 Other external cause status: Secondary | ICD-10-CM | POA: Diagnosis not present

## 2015-07-01 DIAGNOSIS — Z87891 Personal history of nicotine dependence: Secondary | ICD-10-CM | POA: Insufficient documentation

## 2015-07-01 DIAGNOSIS — Y9389 Activity, other specified: Secondary | ICD-10-CM | POA: Insufficient documentation

## 2015-07-01 DIAGNOSIS — Y9289 Other specified places as the place of occurrence of the external cause: Secondary | ICD-10-CM | POA: Diagnosis not present

## 2015-07-01 DIAGNOSIS — Z23 Encounter for immunization: Secondary | ICD-10-CM | POA: Diagnosis not present

## 2015-07-01 DIAGNOSIS — S61210A Laceration without foreign body of right index finger without damage to nail, initial encounter: Secondary | ICD-10-CM | POA: Insufficient documentation

## 2015-07-01 DIAGNOSIS — S61215A Laceration without foreign body of left ring finger without damage to nail, initial encounter: Secondary | ICD-10-CM | POA: Diagnosis not present

## 2015-07-01 MED ORDER — LIDOCAINE HCL (PF) 1 % IJ SOLN
15.0000 mL | Freq: Once | INTRAMUSCULAR | Status: DC
  Filled 2015-07-01: qty 15

## 2015-07-01 MED ORDER — OXYCODONE-ACETAMINOPHEN 5-325 MG PO TABS
1.0000 | ORAL_TABLET | Freq: Once | ORAL | Status: AC
  Administered 2015-07-01: 1 via ORAL
  Filled 2015-07-01: qty 1

## 2015-07-01 MED ORDER — ONDANSETRON 8 MG PO TBDP
8.0000 mg | ORAL_TABLET | Freq: Once | ORAL | Status: AC
  Administered 2015-07-01: 8 mg via ORAL
  Filled 2015-07-01: qty 1

## 2015-07-01 MED ORDER — TETANUS-DIPHTH-ACELL PERTUSSIS 5-2.5-18.5 LF-MCG/0.5 IM SUSP
0.5000 mL | Freq: Once | INTRAMUSCULAR | Status: AC
Start: 2015-07-01 — End: 2015-07-01
  Administered 2015-07-01: 0.5 mL via INTRAMUSCULAR
  Filled 2015-07-01: qty 0.5

## 2015-07-01 NOTE — ED Notes (Addendum)
Pt assessed in triage by RN and MD. VS obtained, lacerations to left hand, 4th finger, and right hand pointer finger assessed, cleansed with NS and wrapped with DSD. Pt to go directly to ED and will not be checking in to urgent care. He is with his wife and daughter, stable condition.

## 2015-07-01 NOTE — ED Notes (Signed)
Pt to ed with c/o lacerations to both hands, right hand second digit, left hand fourth digit.  Left hand laceration on underside of finger approx 1 inch long, mild bleeding, bandage applied.  Right hand laceration deep approx 1 inch, no bleeding noted at this time, bandage applied.

## 2015-07-01 NOTE — ED Notes (Signed)
Lidocaine given to PA for suturing.

## 2015-07-01 NOTE — Discharge Instructions (Signed)
Laceration Care, Adult °A laceration is a cut that goes through all of the layers of the skin and into the tissue that is right under the skin. Some lacerations heal on their own. Others need to be closed with stitches (sutures), staples, skin adhesive strips, or skin glue. Proper laceration care minimizes the risk of infection and helps the laceration to heal better. °HOW TO CARE FOR YOUR LACERATION °If sutures or staples were used: °· Keep the wound clean and dry. °· If you were given a bandage (dressing), you should change it at least one time per day or as told by your health care provider. You should also change it if it becomes wet or dirty. °· Keep the wound completely dry for the first 24 hours or as told by your health care provider. After that time, you may shower or bathe. However, make sure that the wound is not soaked in water until after the sutures or staples have been removed. °· Clean the wound one time each day or as told by your health care provider: °· Wash the wound with soap and water. °· Rinse the wound with water to remove all soap. °· Pat the wound dry with a clean towel. Do not rub the wound. °· After cleaning the wound, apply a thin layer of antibiotic ointment as told by your health care provider. This will help to prevent infection and keep the dressing from sticking to the wound. °· Have the sutures or staples removed as told by your health care provider. °If skin adhesive strips were used: °· Keep the wound clean and dry. °· If you were given a bandage (dressing), you should change it at least one time per day or as told by your health care provider. You should also change it if it becomes dirty or wet. °· Do not get the skin adhesive strips wet. You may shower or bathe, but be careful to keep the wound dry. °· If the wound gets wet, pat it dry with a clean towel. Do not rub the wound. °· Skin adhesive strips fall off on their own. You may trim the strips as the wound heals. Do not  remove skin adhesive strips that are still stuck to the wound. They will fall off in time. °If skin glue was used: °· Try to keep the wound dry, but you may briefly wet it in the shower or bath. Do not soak the wound in water, such as by swimming. °· After you have showered or bathed, gently pat the wound dry with a clean towel. Do not rub the wound. °· Do not do any activities that will make you sweat heavily until the skin glue has fallen off on its own. °· Do not apply liquid, cream, or ointment medicine to the wound while the skin glue is in place. Using those may loosen the film before the wound has healed. °· If you were given a bandage (dressing), you should change it at least one time per day or as told by your health care provider. You should also change it if it becomes dirty or wet. °· If a dressing is placed over the wound, be careful not to apply tape directly over the skin glue. Doing that may cause the glue to be pulled off before the wound has healed. °· Do not pick at the glue. The skin glue usually remains in place for 5-10 days, then it falls off of the skin. °General Instructions °· Take over-the-counter and prescription   medicines only as told by your health care provider. °· If you were prescribed an antibiotic medicine or ointment, take or apply it as told by your doctor. Do not stop using it even if your condition improves. °· To help prevent scarring, make sure to cover your wound with sunscreen whenever you are outside after stitches are removed, after adhesive strips are removed, or when glue remains in place and the wound is healed. Make sure to wear a sunscreen of at least 30 SPF. °· Do not scratch or pick at the wound. °· Keep all follow-up visits as told by your health care provider. This is important. °· Check your wound every day for signs of infection. Watch for: °· Redness, swelling, or pain. °· Fluid, blood, or pus. °· Raise (elevate) the injured area above the level of your heart  while you are sitting or lying down, if possible. °SEEK MEDICAL CARE IF: °· You received a tetanus shot and you have swelling, severe pain, redness, or bleeding at the injection site. °· You have a fever. °· A wound that was closed breaks open. °· You notice a bad smell coming from your wound or your dressing. °· You notice something coming out of the wound, such as wood or glass. °· Your pain is not controlled with medicine. °· You have increased redness, swelling, or pain at the site of your wound. °· You have fluid, blood, or pus coming from your wound. °· You notice a change in the color of your skin near your wound. °· You need to change the dressing frequently due to fluid, blood, or pus draining from the wound. °· You develop a new rash. °· You develop numbness around the wound. °SEEK IMMEDIATE MEDICAL CARE IF: °· You develop severe swelling around the wound. °· Your pain suddenly increases and is severe. °· You develop painful lumps near the wound or on skin that is anywhere on your body. °· You have a red streak going away from your wound. °· The wound is on your hand or foot and you cannot properly move a finger or toe. °· The wound is on your hand or foot and you notice that your fingers or toes look pale or bluish. °  °This information is not intended to replace advice given to you by your health care provider. Make sure you discuss any questions you have with your health care provider. °  °Document Released: 04/15/2005 Document Revised: 08/30/2014 Document Reviewed: 04/11/2014 °Elsevier Interactive Patient Education ©2016 [employer] ° °Stitches, Staples, or Adhesive Wound Closure °Health care providers use stitches (sutures), staples, and certain glue (skin adhesives) to hold skin together while it heals (wound closure). You may need this treatment after you have surgery or if you cut your skin accidentally. These methods help your skin to heal more quickly and make it less likely that you will have  a scar. A wound may take several months to heal completely. °The type of wound you have determines when your wound gets closed. In most cases, the wound is closed as soon as possible (primary skin closure). Sometimes, closure is delayed so the wound can be cleaned and allowed to heal naturally. This reduces the chance of infection. Delayed closure may be needed if your wound: °· Is caused by a bite. °· Happened more than 6 hours ago. °· Involves loss of skin or the tissues under the skin. °· Has dirt or debris in it that cannot be removed. °· Is infected. °WHAT   ARE THE DIFFERENT KINDS OF WOUND CLOSURES? °There are many options for wound closure. The one that your health care provider uses depends on how deep and how large your wound is. °Adhesive Glue °To use this type of glue to close a wound, your health care provider holds the edges of the wound together and paints the glue on the surface of your skin. You may need more than one layer of glue. Then the wound may be covered with a light bandage (dressing). °This type of skin closure may be used for small wounds that are not deep (superficial). Using glue for wound closure is less painful than other methods. It does not require a medicine that numbs the area (local anesthetic). This method also leaves nothing to be removed. Adhesive glue is often used for children and on facial wounds. °Adhesive glue cannot be used for wounds that are deep, uneven, or bleeding. It is not used inside of a wound.  °Adhesive Strips °These strips are made of sticky (adhesive), porous paper. They are applied across your skin edges like a regular adhesive bandage. You leave them on until they fall off. °Adhesive strips may be used to close very superficial wounds. They may also be used along with sutures to improve the closure of your skin edges.  °Sutures °Sutures are the oldest method of wound closure. Sutures can be made from natural substances, such as silk, or from synthetic  materials, such as nylon and steel. They can be made from a material that your body can break down as your wound heals (absorbable), or they can be made from a material that needs to be removed from your skin (nonabsorbable). They come in many different strengths and sizes. °Your health care provider attaches the sutures to a steel needle on one end. Sutures can be passed through your skin, or through the tissues beneath your skin. Then they are tied and cut. Your skin edges may be closed in one continuous stitch or in separate stitches. °Sutures are strong and can be used for all kinds of wounds. Absorbable sutures may be used to close tissues under the skin. The disadvantage of sutures is that they may cause skin reactions that lead to infection. Nonabsorbable sutures need to be removed. °Staples °When surgical staples are used to close a wound, the edges of your skin on both sides of the wound are brought close together. A staple is placed across the wound, and an instrument secures the edges together. Staples are often used to close surgical cuts (incisions). °Staples are faster to use than sutures, and they cause less skin reaction. Staples need to be removed using a tool that bends the staples away from your skin. °HOW DO I CARE FOR MY WOUND CLOSURE? °· Take medicines only as directed by your health care provider. °· If you were prescribed an antibiotic medicine for your wound, finish it all even if you start to feel better. °· Use ointments or creams only as directed by your health care provider. °· Wash your hands with soap and water before and after touching your wound. °· Do not soak your wound in water. Do not take baths, swim, or use a hot tub until your health care provider approves. °· Ask your health care provider when you can start showering. Cover your wound if directed by your health care provider. °· Do not take out your own sutures or staples. °· Do not pick at your wound. Picking can cause an  infection. °·   Keep all follow-up visits as directed by your health care provider. This is important. °HOW LONG WILL I HAVE MY WOUND CLOSURE? °· Leave adhesive glue on your skin until the glue peels away. °· Leave adhesive strips on your skin until the strips fall off. °· Absorbable sutures will dissolve within several days. °· Nonabsorbable sutures and staples must be removed. The location of the wound will determine how long they stay in. This can range from several days to a couple of weeks. °WHEN SHOULD I SEEK HELP FOR MY WOUND CLOSURE? °Contact your health care provider if: °· You have a fever. °· You have chills. °· You have drainage, redness, swelling, or pain at your wound. °· There is a bad smell coming from your wound. °· The skin edges of your wound start to separate after your sutures have been removed. °· Your wound becomes thick, raised, and darker in color after your sutures come out (scarring). °  °This information is not intended to replace advice given to you by your health care provider. Make sure you discuss any questions you have with your health care provider. °  °Document Released: 01/08/2001 Document Revised: 05/06/2014 Document Reviewed: 09/22/2013 °Elsevier Interactive Patient Education ©2016 [employer] ° °

## 2015-07-01 NOTE — ED Notes (Signed)
Pt has a laceration to the rt 2nd digit and avulsion of skin with finger tip bruising to his left 4th digit.

## 2015-07-01 NOTE — ED Provider Notes (Signed)
Patient presents today with laceration to the right second digit and also the left fourth digit. Patient denies any other injury. He states that his injury happened after trying to back a horse out of a trailer when his fingers got caught. He denies being on any blood thinners but does take aspirin daily. Both areas are bleeding. The right second digit does have a significant deep laceration to the distal aspect, it does appear that he has full range of motion and is neurovascularly intact. I believe he will need further evaluation and treatment for this than what we can offer here today. He will likely need imaging of the area and possible consult or referral with orthopedics. The left fourth digit does have a laceration along the side of the digit which seems more superficial. The area was dressed and the patient was told to go to the ER at this time. Patient's vitals were stable upon discharge.  Paulina Fusi, MD 07/01/15 1620

## 2015-07-01 NOTE — ED Provider Notes (Signed)
[facility] Emergency Department Provider Note  ____________________________________________  Time seen: Approximately 6:21 PM  I have reviewed the triage vital signs and the nursing notes.   HISTORY  Chief Complaint Laceration    HPI Shawn Lowery is a 70 y.o. male who presents emergency department complaining of lacerations to the fourth digit left hand and second digit right hand. Patient states that he was loading a horse on a trailer when his hands which were holding a rope were caught up against the window frame of the trailer. Patient states that bleeding is controlled at time of arrival. Patient is endorsing pain to the entire fourth digit left hand and pain over the laceration to the right. Patient is not taking any medications prior to arrival. Patient is on no blood thinners. Patient has sensation to the distal aspect of both involved fingers. Patient is complaining of no other injuries or complaints at this time.   Past Medical History  Diagnosis Date  . Hyperlipemia   . Diverticulosis   . Diverticulitis   . Hypertension   . CAD (coronary artery disease)   . Asthma   . Rectal bleeding   . IGT (impaired glucose tolerance)   . Arthritis     Patient Active Problem List   Diagnosis Date Noted  . Airway hyperreactivity 01/06/2015  . Hypercholesteremia 01/06/2015  . CAD (coronary artery disease) 12/31/2014  . IGT (impaired glucose tolerance) 12/31/2014  . Lower GI bleed 12/31/2014  . Hypotension 12/27/2014  . Essential hypertension 12/27/2014  . BPH (benign prostatic hypertrophy) with urinary retention 12/27/2014  . Diverticular hemorrhage 12/27/2014  . Hypomagnesemia 12/25/2014  . Chronic sinusitis 12/25/2014  . Rectal bleed 12/24/2014  . Acute lower GI bleeding   . Poor venous access   . GI bleed 12/23/2014    Past Surgical History  Procedure Laterality Date  . Hernia repair    . Colonoscopy with propofol N/A 02/10/2015     Procedure: COLONOSCOPY WITH PROPOFOL;  Surgeon: Lollie Sails, MD;  Location: [facility] ENDOSCOPY;  Service: Endoscopy;  Laterality: N/A;  . Colonoscopy with propofol N/A 02/13/2015    Procedure: COLONOSCOPY WITH PROPOFOL;  Surgeon: Lollie Sails, MD;  Location: [facility] ENDOSCOPY;  Service: Endoscopy;  Laterality: N/A;    Current Outpatient Rx  Name  Route  Sig  Dispense  Refill  . Ascorbic Acid (VITAMIN C) 1000 MG tablet   Oral   Take 1,000 mg by mouth daily.         . finasteride (PROSCAR) 5 MG tablet      TAKE ONE TABLET BY MOUTH ONCE DAILY   30 tablet   3   . Fluticasone-Salmeterol (ADVAIR) 100-50 MCG/DOSE AEPB   Inhalation   Inhale 1 puff into the lungs 2 (two) times daily as needed (for wheezing or shortness of breath).          Marland Kitchen GAVILYTE-N WITH FLAVOR PACK 420 G solution                 Dispense as written.   Marland Kitchen lisinopril-hydrochlorothiazide (PRINZIDE,ZESTORETIC) 20-25 MG per tablet   Oral   Take 1 tablet by mouth daily.         Marland Kitchen lovastatin (MEVACOR) 20 MG tablet   Oral   Take 20 mg by mouth daily.          . Multiple Vitamin (MULTIVITAMIN WITH MINERALS) TABS tablet   Oral   Take 1 tablet by mouth every Monday, Wednesday, and Friday.         Marland Kitchen  nystatin ointment (MYCOSTATIN)   Topical   Apply 1 application topically 2 (two) times daily.         . polyethylene glycol (MIRALAX / GLYCOLAX) packet   Oral   Take 17 g by mouth 3 (three) times daily. Patient not taking: Reported on 02/13/2015   14 each   0   . sodium chloride (OCEAN) 0.65 % SOLN nasal spray   Each Nare   Place 1 spray into both nostrils as needed for congestion.         . tamsulosin (FLOMAX) 0.4 MG CAPS capsule      TAKE ONE CAPSULE BY MOUTH ONCE DAILY AFTER SUPPER   30 capsule   0     Allergies Review of patient's allergies indicates no known allergies.  Family History  Problem Relation Age of Onset  . Hypertension Father   . CAD Father   . Stroke Mother      Social History Social History  Substance Use Topics  . Smoking status: Former Smoker -- 1.00 packs/day for 15 years  . Smokeless tobacco: Former Systems developer    Quit date: 12/31/1983  . Alcohol Use: No     Review of Systems  Constitutional: No fever/chills Musculoskeletal: Negative for back pain. Pain to the fourth digit left hand. Skin: Negative for rash. Ulceration to the fourth digit left hand and second digit right hand. Neurological: Negative for headaches, focal weakness or numbness. 10-point ROS otherwise negative.  ____________________________________________   PHYSICAL EXAM:  VITAL SIGNS: ED Triage Vitals  Enc Vitals Group     BP 07/01/15 1649 135/66 mmHg     Pulse Rate 07/01/15 1649 69     Resp 07/01/15 1649 20     Temp 07/01/15 1649 97.5 F (36.4 C)     Temp Source 07/01/15 1649 Oral     SpO2 07/01/15 1649 96 %     Weight 07/01/15 1649 190 lb (86.183 kg)     Height --      Head Cir --      Peak Flow --      Pain Score 07/01/15 1650 0     Pain Loc --      Pain Edu? --      Excl. in [place]? --      Constitutional: Alert and oriented. Well appearing and in no acute distress. Eyes: Conjunctivae are normal. PERRL. EOMI. Head: Atraumatic. Cardiovascular: Normal rate, regular rhythm. Normal S1 and S2.  Good peripheral circulation. Respiratory: Normal respiratory effort without tachypnea or retractions. Lungs CTAB. Musculoskeletal: No lower extremity tenderness nor edema.  No joint effusions. No visible bony abnormalities to bilateral hands. Limited range of motion to the second digit and fourth digit of the right and left hand respectively. Patient has tenderness to palpation of the entire fourth digit left hand. No palpable abnormality. Laceration is noted to the medial aspect of the fourth digit left hand. Sensation and capillary refill are intact distally. Full range of motion to second digit right hand. Laceration is noted to the palmar aspect of the digit. Sensation  and cap refill intact distally. Neurologic:  Normal speech and language. No gross focal neurologic deficits are appreciated.  Skin:  Skin is warm, dry and intact. No rash noted. Lacerations noted to the second right hand and fourth digit left hand. Laceration to the second digit right hand is deep but no visible foreign body or tendon or ligament injury is identified. Eating is controlled. Lacerations approximately 2 and half centimeters in  length. Psychiatric: Mood and affect are normal. Speech and behavior are normal. Patient exhibits appropriate insight and judgement.   ____________________________________________   LABS (all labs ordered are listed, but only abnormal results are displayed)  Labs Reviewed - No data to display ____________________________________________  EKG   ____________________________________________  RADIOLOGY Diamantina Providence Ifeoma Vallin, personally viewed and evaluated these images (plain radiographs) as part of my medical decision making, as well as reviewing the written report by the radiologist.  Dg Hand Complete Left  07/01/2015  CLINICAL DATA:  Was trying to get a horse into a trailer and the horse acted up, slammed RIGHT hand between horse and trailer, laceration third digit LEFT hand from rope EXAM: LEFT HAND - COMPLETE 3+ VIEW COMPARISON:  None FINDINGS: Osseous mineralization normal. Joint space narrowing third MCP joint. Joint spaces otherwise preserved. No acute fracture, dislocation, or bone destruction. Minimal chondrocalcinosis at triangular fibrocartilage complex question CPPD. IMPRESSION: No acute bony abnormalities. Degenerative changes third MCP joint. Electronically Signed   By: Lavonia Dana M.D.   On: 07/01/2015 18:34   Dg Hand Complete Right  07/01/2015  CLINICAL DATA:  Slammed hand between worse and trailer. Right hand injury with pain and swelling. Initial encounter. EXAM: RIGHT HAND - COMPLETE 3+ VIEW COMPARISON:  None. FINDINGS: There is no  evidence of fracture or dislocation. Mild to moderate osteoarthritis is seen involving the third MCP joint. No other significant bone abnormality identified. IMPRESSION: No acute findings. Osteoarthritis of third MCP joint. Electronically Signed   By: Earle Gell M.D.   On: 07/01/2015 18:38    ____________________________________________    PROCEDURES  Procedure(s) performed:    LACERATION REPAIR Performed by: Darletta Moll Authorized by: Charline Bills Takhia Spoon Consent: Verbal consent obtained. Risks and benefits: risks, benefits and alternatives were discussed Consent given by: patient Patient identity confirmed: provided demographic data Prepped and Draped in normal sterile fashion Wound explored  Laceration Location: Second digit right hand  Laceration Length: 3 cm  No Foreign Bodies seen or palpated  Anesthesia: Digital block   Local anesthetic: lidocaine 1 % without epinephrine  Anesthetic total: 5 ml  Irrigation method: syringe Amount of cleaning: standard  Skin closure: 4-0 Ethilon sutures   Number of sutures: 9   Technique: Simple interrupted   Patient tolerance: Patient tolerated the procedure well with no immediate complications.    LACERATION REPAIR Performed by: Darletta Moll Authorized by: Charline Bills Tria Noguera Consent: Verbal consent obtained. Risks and benefits: risks, benefits and alternatives were discussed Consent given by: patient Patient identity confirmed: provided demographic data Prepped and Draped in normal sterile fashion Wound explored  Laceration Location: Fourth digit left hand  Laceration Length: 2.5 cm  No Foreign Bodies seen or palpated  Anesthesia: Digital block   Local anesthetic: lidocaine 1 % without epinephrine  Anesthetic total: 5 ml  Irrigation method: syringe Amount of cleaning: standard  Skin closure: 4-0 Ethilon sutures   Number of sutures: 5  Technique: Simple interrupted   Patient  tolerance: Patient tolerated the procedure well with no immediate complications.   Medications  lidocaine (PF) (XYLOCAINE) 1 % injection 15 mL (not administered)  Tdap (BOOSTRIX) injection 0.5 mL (not administered)  oxyCODONE-acetaminophen (PERCOCET/ROXICET) 5-325 MG per tablet 1 tablet (1 tablet Oral Given 07/01/15 1802)  ondansetron (ZOFRAN-ODT) disintegrating tablet 8 mg (8 mg Oral Given 07/01/15 1802)     ____________________________________________   INITIAL IMPRESSION / ASSESSMENT AND PLAN / ED COURSE  Pertinent labs & imaging results that were available during  my care of the patient were reviewed by me and considered in my medical decision making (see chart for details).  Patient's diagnosis is consistent with a laceration to the second digit right hand lacerations of the fourth digit left hand. Lacerations are closed as described above. X-rays are negative for underlying fractures. Patient is given tetanus shot here in the emergency department patient may use Tylenol and Motrin at home for additional symptom control. Patient will follow-up with primary care in 1 week for suture removal.. Patient is given ED precautions to return to the ED for any worsening or new symptoms.     ____________________________________________  FINAL CLINICAL IMPRESSION(S) / ED DIAGNOSES  Final diagnoses:  Laceration of finger of right hand, initial encounter  Laceration of finger of left hand, initial encounter      NEW MEDICATIONS STARTED DURING THIS VISIT:  New Prescriptions   No medications on file        Darletta Moll, PA-C 07/01/15 1944  Harvest Dark, MD 07/01/15 2226

## 2015-07-07 DIAGNOSIS — S61210A Laceration without foreign body of right index finger without damage to nail, initial encounter: Secondary | ICD-10-CM | POA: Diagnosis not present

## 2015-07-07 DIAGNOSIS — S61215A Laceration without foreign body of left ring finger without damage to nail, initial encounter: Secondary | ICD-10-CM | POA: Diagnosis not present

## 2015-09-01 DIAGNOSIS — J342 Deviated nasal septum: Secondary | ICD-10-CM | POA: Diagnosis not present

## 2015-09-01 DIAGNOSIS — R0981 Nasal congestion: Secondary | ICD-10-CM | POA: Diagnosis not present

## 2015-09-01 DIAGNOSIS — J339 Nasal polyp, unspecified: Secondary | ICD-10-CM | POA: Diagnosis not present

## 2015-09-15 DIAGNOSIS — R0981 Nasal congestion: Secondary | ICD-10-CM | POA: Diagnosis not present

## 2015-09-20 DIAGNOSIS — J343 Hypertrophy of nasal turbinates: Secondary | ICD-10-CM | POA: Diagnosis not present

## 2015-09-20 DIAGNOSIS — J329 Chronic sinusitis, unspecified: Secondary | ICD-10-CM | POA: Diagnosis not present

## 2015-09-20 DIAGNOSIS — J342 Deviated nasal septum: Secondary | ICD-10-CM | POA: Diagnosis not present

## 2015-09-20 DIAGNOSIS — J339 Nasal polyp, unspecified: Secondary | ICD-10-CM | POA: Diagnosis not present

## 2015-09-29 DIAGNOSIS — I10 Essential (primary) hypertension: Secondary | ICD-10-CM | POA: Diagnosis not present

## 2015-09-29 DIAGNOSIS — E782 Mixed hyperlipidemia: Secondary | ICD-10-CM | POA: Diagnosis not present

## 2015-09-29 DIAGNOSIS — N401 Enlarged prostate with lower urinary tract symptoms: Secondary | ICD-10-CM | POA: Diagnosis not present

## 2015-09-29 DIAGNOSIS — R338 Other retention of urine: Secondary | ICD-10-CM | POA: Diagnosis not present

## 2015-09-29 DIAGNOSIS — Z79899 Other long term (current) drug therapy: Secondary | ICD-10-CM | POA: Diagnosis not present

## 2015-09-29 DIAGNOSIS — I480 Paroxysmal atrial fibrillation: Secondary | ICD-10-CM | POA: Diagnosis not present

## 2015-09-29 DIAGNOSIS — J4520 Mild intermittent asthma, uncomplicated: Secondary | ICD-10-CM | POA: Diagnosis not present

## 2015-10-25 ENCOUNTER — Encounter: Payer: Self-pay | Admitting: *Deleted

## 2015-10-27 DIAGNOSIS — J343 Hypertrophy of nasal turbinates: Secondary | ICD-10-CM | POA: Diagnosis not present

## 2015-10-27 DIAGNOSIS — J342 Deviated nasal septum: Secondary | ICD-10-CM | POA: Diagnosis not present

## 2015-10-27 DIAGNOSIS — J339 Nasal polyp, unspecified: Secondary | ICD-10-CM | POA: Diagnosis not present

## 2015-10-27 DIAGNOSIS — J301 Allergic rhinitis due to pollen: Secondary | ICD-10-CM | POA: Diagnosis not present

## 2015-10-27 DIAGNOSIS — J329 Chronic sinusitis, unspecified: Secondary | ICD-10-CM | POA: Diagnosis not present

## 2015-11-02 ENCOUNTER — Encounter
Admission: RE | Disposition: A | Discharge status: Home / Self Care | Payer: Self-pay | Source: Ambulatory Visit | Attending: Otolaryngology

## 2015-11-02 ENCOUNTER — Ambulatory Visit
Admission: RE | Admit: 2015-11-02 | Discharge: 2015-11-02 | Disposition: A | Discharge status: Home / Self Care | Payer: 59 | Source: Ambulatory Visit | Attending: Otolaryngology | Admitting: Otolaryngology

## 2015-11-02 ENCOUNTER — Other Ambulatory Visit
Admission: RE | Admit: 2015-11-02 | Discharge: 2015-11-02 | Disposition: A | Discharge status: Home / Self Care | Payer: 59 | Source: Ambulatory Visit | Attending: Otolaryngology | Admitting: Otolaryngology

## 2015-11-02 ENCOUNTER — Encounter: Payer: Self-pay | Admitting: Otolaryngology

## 2015-11-02 ENCOUNTER — Ambulatory Visit: Payer: 59 | Admitting: Anesthesiology

## 2015-11-02 DIAGNOSIS — I480 Paroxysmal atrial fibrillation: Secondary | ICD-10-CM | POA: Diagnosis not present

## 2015-11-02 DIAGNOSIS — Z79899 Other long term (current) drug therapy: Secondary | ICD-10-CM | POA: Insufficient documentation

## 2015-11-02 DIAGNOSIS — Z5309 Procedure and treatment not carried out because of other contraindication: Secondary | ICD-10-CM | POA: Insufficient documentation

## 2015-11-02 DIAGNOSIS — J342 Deviated nasal septum: Secondary | ICD-10-CM | POA: Insufficient documentation

## 2015-11-02 DIAGNOSIS — I2510 Atherosclerotic heart disease of native coronary artery without angina pectoris: Secondary | ICD-10-CM | POA: Diagnosis not present

## 2015-11-02 DIAGNOSIS — J339 Nasal polyp, unspecified: Secondary | ICD-10-CM | POA: Insufficient documentation

## 2015-11-02 DIAGNOSIS — I4891 Unspecified atrial fibrillation: Secondary | ICD-10-CM | POA: Insufficient documentation

## 2015-11-02 DIAGNOSIS — J45909 Unspecified asthma, uncomplicated: Secondary | ICD-10-CM | POA: Insufficient documentation

## 2015-11-02 DIAGNOSIS — I10 Essential (primary) hypertension: Secondary | ICD-10-CM | POA: Diagnosis not present

## 2015-11-02 DIAGNOSIS — J343 Hypertrophy of nasal turbinates: Secondary | ICD-10-CM | POA: Insufficient documentation

## 2015-11-02 DIAGNOSIS — Z87891 Personal history of nicotine dependence: Secondary | ICD-10-CM | POA: Diagnosis not present

## 2015-11-02 DIAGNOSIS — J321 Chronic frontal sinusitis: Secondary | ICD-10-CM | POA: Diagnosis not present

## 2015-11-02 DIAGNOSIS — J322 Chronic ethmoidal sinusitis: Secondary | ICD-10-CM | POA: Diagnosis not present

## 2015-11-02 DIAGNOSIS — J320 Chronic maxillary sinusitis: Secondary | ICD-10-CM | POA: Diagnosis not present

## 2015-11-02 DIAGNOSIS — E7800 Pure hypercholesterolemia, unspecified: Secondary | ICD-10-CM | POA: Diagnosis not present

## 2015-11-02 LAB — CKMB (ARMC ONLY): CK, MB: 1.3 ng/mL (ref 0.5–5.0)

## 2015-11-02 LAB — TROPONIN I: Troponin I: <0.03 ng/mL (ref <0.03)

## 2015-11-02 SURGERY — SINUS SURGERY, WITH IMAGING GUIDANCE
Anesthesia: General | Wound class: Clean Contaminated

## 2015-11-02 MED ORDER — LIDOCAINE HCL (CARDIAC) 20 MG/ML IV SOLN
INTRAVENOUS | Status: DC | PRN
  Administered 2015-11-02: 30 mg via INTRAVENOUS

## 2015-11-02 MED ORDER — SUCCINYLCHOLINE CHLORIDE 20 MG/ML IJ SOLN
INTRAMUSCULAR | Status: DC | PRN
  Administered 2015-11-02: 100 mg via INTRAVENOUS

## 2015-11-02 MED ORDER — SUGAMMADEX SODIUM 500 MG/5ML IV SOLN
INTRAVENOUS | Status: DC | PRN
  Administered 2015-11-02: 400 mg via INTRAVENOUS

## 2015-11-02 MED ORDER — FENTANYL CITRATE (PF) 100 MCG/2ML IJ SOLN
INTRAMUSCULAR | Status: DC | PRN
  Administered 2015-11-02: 50 ug via INTRAVENOUS

## 2015-11-02 MED ORDER — DEXAMETHASONE SODIUM PHOSPHATE 4 MG/ML IJ SOLN
INTRAMUSCULAR | Status: DC | PRN
  Administered 2015-11-02: 8 mg via INTRAVENOUS

## 2015-11-02 MED ORDER — CEFAZOLIN SODIUM-DEXTROSE 2-4 GM/100ML-% IV SOLN
2.0000 g | Freq: Once | INTRAVENOUS | Status: AC
  Administered 2015-11-02: 2 g via INTRAVENOUS

## 2015-11-02 MED ORDER — OXYMETAZOLINE HCL 0.05 % NA SOLN
2.0000 | Freq: Once | NASAL | Status: AC
  Administered 2015-11-02: 2 via NASAL

## 2015-11-02 MED ORDER — LIDOCAINE-EPINEPHRINE 1 %-1:100000 IJ SOLN
INTRAMUSCULAR | Status: DC | PRN
  Administered 2015-11-02: 6.5 mL

## 2015-11-02 MED ORDER — PROPOFOL 10 MG/ML IV BOLUS
INTRAVENOUS | Status: DC | PRN
  Administered 2015-11-02: 150 mg via INTRAVENOUS

## 2015-11-02 MED ORDER — FENTANYL CITRATE (PF) 100 MCG/2ML IJ SOLN: 25.0000 ug | INTRAMUSCULAR | Status: DC | PRN

## 2015-11-02 MED ORDER — PHENYLEPHRINE HCL 0.5 % NA SOLN
NASAL | Status: DC | PRN
  Administered 2015-11-02: 30 mL via TOPICAL

## 2015-11-02 MED ORDER — ROCURONIUM BROMIDE 100 MG/10ML IV SOLN
INTRAVENOUS | Status: DC | PRN
  Administered 2015-11-02: 30 mg via INTRAVENOUS

## 2015-11-02 MED ORDER — SCOPOLAMINE 1 MG/3DAYS TD PT72
1.0000 | MEDICATED_PATCH | Freq: Once | TRANSDERMAL | Status: DC
  Administered 2015-11-02: 1.5 mg via TRANSDERMAL

## 2015-11-02 MED ORDER — ONDANSETRON HCL 4 MG/2ML IJ SOLN: 4.0000 mg | Freq: Once | INTRAMUSCULAR | Status: DC | PRN

## 2015-11-02 MED ORDER — LACTATED RINGERS IV SOLN
INTRAVENOUS | Status: DC
  Administered 2015-11-02: 08:00:00 via INTRAVENOUS

## 2015-11-02 MED ORDER — ACETAMINOPHEN 10 MG/ML IV SOLN
1000.0000 mg | Freq: Once | INTRAVENOUS | Status: AC
  Administered 2015-11-02: 1000 mg via INTRAVENOUS

## 2015-11-02 MED ORDER — OXYCODONE HCL 5 MG PO TABS: 5.0000 mg | ORAL_TABLET | Freq: Once | ORAL | Status: DC

## 2015-11-02 MED ORDER — ONDANSETRON HCL 4 MG/2ML IJ SOLN
INTRAMUSCULAR | Status: DC | PRN
  Administered 2015-11-02: 4 mg via INTRAVENOUS

## 2015-11-02 SURGICAL SUPPLY — 45 items
BALLOON SINUPLASTY SYSTEM (BALLOONS) IMPLANT
BATTERY INSTRU NAVIGATION (MISCELLANEOUS) ×16 IMPLANT
BLADE SURG 15 STRL LF DISP TIS (BLADE) IMPLANT
BLADE SURG 15 STRL SS (BLADE)
CANISTER SUCT 1200ML W/VALVE (MISCELLANEOUS) ×4 IMPLANT
CATH IV 18X1 1/4 SAFELET (CATHETERS) ×4 IMPLANT
COAG SUCT 10F 3.5MM HAND CTRL (MISCELLANEOUS) ×4 IMPLANT
COAGULATOR SUCT 8FR VV (MISCELLANEOUS) IMPLANT
DEVICE INFLATION SEID (MISCELLANEOUS) IMPLANT
DRAPE HEAD BAR (DRAPES) ×4 IMPLANT
DRESSING NASL FOAM PST OP SINU (MISCELLANEOUS) IMPLANT
DRSG NASAL FOAM POST OP SINU (MISCELLANEOUS)
GLOVE PI ULTRA LF STRL 7.5 (GLOVE) ×3 IMPLANT
GLOVE PI ULTRA NON LATEX 7.5 (GLOVE) ×1
IRRIGATOR 4MM STR (IRRIGATION / IRRIGATOR) IMPLANT
IV CATH 18X1 1/4 SAFELET (CATHETERS) ×3
IV NS 500ML (IV SOLUTION) ×1
IV NS 500ML BAXH (IV SOLUTION) ×3 IMPLANT
KIT ROOM TURNOVER OR (KITS) ×4 IMPLANT
NAVIGATION MASK REG  ST (MISCELLANEOUS) ×4 IMPLANT
NEEDLE HYPO 25GX1X1/2 BEV (NEEDLE) ×4 IMPLANT
NEEDLE SPNL 25GX3.5 QUINCKE BL (NEEDLE) ×4 IMPLANT
NS IRRIG 500ML POUR BTL (IV SOLUTION) ×4 IMPLANT
PACK DRAPE NASAL/ENT (PACKS) ×4 IMPLANT
PACKING NASAL EPIS 4X2.4 XEROG (MISCELLANEOUS) ×4 IMPLANT
PAD GROUND ADULT SPLIT (MISCELLANEOUS) ×4 IMPLANT
PATTIES SURGICAL .5 X3 (DISPOSABLE) ×4 IMPLANT
SET HANDPIECE IRR DIEGO (MISCELLANEOUS) ×40 IMPLANT
SHAVER DIEGO BLD STD TYPE A (BLADE) ×4 IMPLANT
SOL ANTI-FOG 6CC FOG-OUT (MISCELLANEOUS) ×3 IMPLANT
SOL FOG-OUT ANTI-FOG 6CC (MISCELLANEOUS) ×1
SPLINT NASAL SEPTAL BLV .50 ST (MISCELLANEOUS) IMPLANT
STRAP BODY AND KNEE 60X3 (MISCELLANEOUS) ×8 IMPLANT
SUT CHROMIC 3-0 (SUTURE)
SUT CHROMIC 3-0 KS 27XMFL CR (SUTURE)
SUT ETHILON 3-0 KS 30 BLK (SUTURE) IMPLANT
SUT ETHILON 4-0 (SUTURE)
SUT ETHILON 4-0 FS2 18XMFL BLK (SUTURE)
SUT PLAIN GUT 4-0 (SUTURE) IMPLANT
SUTURE CHRMC 3-0 KS 27XMFL CR (SUTURE) IMPLANT
SUTURE ETHLN 4-0 FS2 18XMF BLK (SUTURE) IMPLANT
SYR 3ML LL SCALE MARK (SYRINGE) ×4 IMPLANT
TOWEL OR 17X26 4PK STRL BLUE (TOWEL DISPOSABLE) ×4 IMPLANT
TUBING DECLOG (TUBING) ×4 IMPLANT
WATER STERILE IRR 500ML POUR (IV SOLUTION) ×4 IMPLANT

## 2015-11-02 NOTE — OR Nursing (Signed)
Procedure cancelled after packing and injection of nose by surgeon of Lidocaine 1% with epinephrine 1:100,000 x 6.5 ml.Patient discharged with a knumb nose.Procedure was cancelled by Anesthesiologist and surgeon Dr. Junious Dresser and Dr. Margaretha Sheffield due to cardiac issues.

## 2015-11-02 NOTE — Anesthesia Procedure Notes (Addendum)
Procedure Name: Intubation Date/Time: 11/02/2015 9:36 AM Performed by: Londell Moh Pre-anesthesia Checklist: Patient identified, Emergency Drugs available, Suction available, Patient being monitored and Timeout performed Patient Re-evaluated:Patient Re-evaluated prior to inductionOxygen Delivery Method: Circle system utilized Preoxygenation: Pre-oxygenation with 100% oxygen Intubation Type: IV induction Ventilation: Mask ventilation without difficulty Laryngoscope Size: Mac and 3 Grade View: Grade I Tube type: Oral Rae Tube size: 7.5 mm Number of attempts: 1 Airway Equipment and Method: Bougie stylet Placement Confirmation: ETT inserted through vocal cords under direct vision,  positive ETCO2 and breath sounds checked- equal and bilateral Tube secured with: Tape Dental Injury: Teeth and Oropharynx as per pre-operative assessment  Difficulty Due To: Difficult Airway- due to immobile epiglottis and Difficult Airway- due to large tongue

## 2015-11-02 NOTE — Anesthesia Preprocedure Evaluation (Signed)
Anesthesia Evaluation  Patient identified by MRN, date of birth, ID band  Reviewed: Allergy & Precautions, H&P , NPO status , Patient's Chart, lab work & pertinent test results  Airway Mallampati: III  TM Distance: >3 FB Neck ROM: full    Dental no notable dental hx. (+) Poor Dentition, Dental Advidsory Given   Pulmonary asthma , former smoker,    Pulmonary exam normal        Cardiovascular hypertension, + CAD  Atrial Fibrillation  Rhythm:regular Rate:Normal     Neuro/Psych    GI/Hepatic   Endo/Other    Renal/GU      Musculoskeletal   Abdominal   Peds  Hematology   Anesthesia Other Findings   Reproductive/Obstetrics                             Anesthesia Physical Anesthesia Plan  ASA: III  Anesthesia Plan: General ETT   Post-op Pain Management:    Induction: Intravenous  Airway Management Planned: Oral ETT  Additional Equipment:   Intra-op Plan:   Post-operative Plan:   Informed Consent: I have reviewed the patients History and Physical, chart, labs and discussed the procedure including the risks, benefits and alternatives for the proposed anesthesia with the patient or authorized representative who has indicated his/her understanding and acceptance.     Plan Discussed with: CRNA  Anesthesia Plan Comments:         Anesthesia Quick Evaluation

## 2015-11-02 NOTE — Progress Notes (Signed)
70 y/o male presented to [place] for sinus surgery with Dr. Kathyrn Sheriff.  Pre-procedure evaluation significant for HTN, and distant h/o PAF.  Premedicated on transport to OR.  Monitors placed and pt induced without complication.  Shortly after induction, during procedural prep with epi soaked pledgets, CRNA noted abnormal cardiac rhythm.  SBP in the 115-120 range, with HR 70s-80s.  Rhythm was narrow complex, irregular, with p-waves present, and flipped T waves.  Concern for an ischemic event resulted in cancellation of planned procedure.  EKG tracing returned to normal with SBP of about 150.  Pt emerged and was extubated uneventfully.  PACU 12-lead EKG showed NSR with 1st degree HB, and recovered T waves.  Pt felt fine and had not complaints.  Cardiac enzymes drawn and I called Pts Cardiologist, at [facility].  Spoke to Dr. Ubaldo Glassing, and gave report.  We agreed pt is currently stable, but needs cardiac evaluation today.  Pt will go to [facility] today to be seen and evaluated for further work up.

## 2015-11-02 NOTE — Discharge Instructions (Signed)

## 2015-11-02 NOTE — Transfer of Care (Signed)
Immediate Anesthesia Transfer of Care Note  Patient: Shawn Lowery  Procedure(s) Performed: Procedure(s) with comments: IMAGE GUIDED SINUS SURGERY (N/A) - PROPEL GAVE DISK TO CECE SEPTOPLASTY (N/A) MAXILLARY ANTROSTOMY WITH REMOVAL OF TISSUE (Bilateral) ETHMOIDECTOMY (Bilateral) PARTIAL TURBINATE REDUCTION (Bilateral) POLYPECTOMY NASAL (Left) FRONTAL SINUS EXPLORATION (Bilateral)  Patient Location: PACU  Anesthesia Type: General ETT  Level of Consciousness: awake, alert  and patient cooperative  Airway and Oxygen Therapy: Patient Spontanous Breathing and Patient connected to supplemental oxygen  Post-op Assessment: Post-op Vital signs reviewed, Patient's Cardiovascular Status Stable, Respiratory Function Stable, Patent Airway and No signs of Nausea or vomiting  Post-op Vital Signs: Reviewed and stable  Complications: No apparent anesthesia complications

## 2015-11-02 NOTE — Op Note (Signed)
11/02/2015 10:05 am  ENT Note. The patient was taken to the operating room and anesthesia was given.  Local anesthesia of 6.5 ml Lidocaine with epi 1:100,000 was infiltrated into the septum, left nasal polyp, and inferior turbinates. Topical anesthesia of lidocaine and afrin on cotton pledgetts were placed in both nostrils. Anesthesia then noted some slowing of the heart rate, and some changes in the T waves on EKG monitoring. The cotton pledgetts were immediately removed from the nose.  The patients was watched for a while and it seemed as tho the rhythm stabilized, but it was deemed too risky to proceed with surgery.  The patient was awakened and stable in the recovery room. A 12 lead EKG was to be done and Dr Junious Dresser from anesthesia was going to discuss this with Dr Nehemiah Massed who is his cardiologist.  The patient will let me know once his cardiac issues are resolved, and then we will reschedule his surgery again.   Margaretha Sheffield

## 2015-11-02 NOTE — Anesthesia Postprocedure Evaluation (Signed)
Anesthesia Post Note  Patient: Shawn Lowery  Procedure(s) Performed: * No procedures listed *  Patient location during evaluation: PACU Anesthesia Type: General Level of consciousness: awake and alert and oriented Pain management: satisfactory to patient Vital Signs Assessment: post-procedure vital signs reviewed and stable Respiratory status: spontaneous breathing, nonlabored ventilation and respiratory function stable Cardiovascular status: blood pressure returned to baseline and stable Postop Assessment: Adequate PO intake and No signs of nausea or vomiting Anesthetic complications: no Comments: See progress note for detail of procedure cancellation.    Raliegh Ip

## 2015-11-02 NOTE — H&P (Signed)
  H&P has been reviewed and no changes necessary. To be downloaded later. 

## 2015-11-03 ENCOUNTER — Encounter: Payer: Self-pay | Admitting: Otolaryngology

## 2015-11-13 DIAGNOSIS — I480 Paroxysmal atrial fibrillation: Secondary | ICD-10-CM | POA: Diagnosis not present

## 2015-11-30 DIAGNOSIS — I480 Paroxysmal atrial fibrillation: Secondary | ICD-10-CM | POA: Diagnosis not present

## 2015-12-01 DIAGNOSIS — I480 Paroxysmal atrial fibrillation: Secondary | ICD-10-CM | POA: Diagnosis not present

## 2015-12-01 DIAGNOSIS — R001 Bradycardia, unspecified: Secondary | ICD-10-CM | POA: Diagnosis not present

## 2015-12-01 DIAGNOSIS — I2510 Atherosclerotic heart disease of native coronary artery without angina pectoris: Secondary | ICD-10-CM | POA: Diagnosis not present

## 2015-12-01 DIAGNOSIS — I10 Essential (primary) hypertension: Secondary | ICD-10-CM | POA: Diagnosis not present

## 2015-12-06 DIAGNOSIS — D2311 Other benign neoplasm of skin of right eyelid, including canthus: Secondary | ICD-10-CM | POA: Diagnosis not present

## 2016-01-05 DIAGNOSIS — I10 Essential (primary) hypertension: Secondary | ICD-10-CM | POA: Diagnosis not present

## 2016-01-05 DIAGNOSIS — I2510 Atherosclerotic heart disease of native coronary artery without angina pectoris: Secondary | ICD-10-CM | POA: Diagnosis not present

## 2016-04-23 DIAGNOSIS — Z131 Encounter for screening for diabetes mellitus: Secondary | ICD-10-CM | POA: Diagnosis not present

## 2016-04-23 DIAGNOSIS — Z125 Encounter for screening for malignant neoplasm of prostate: Secondary | ICD-10-CM | POA: Diagnosis not present

## 2016-04-23 DIAGNOSIS — Z0000 Encounter for general adult medical examination without abnormal findings: Secondary | ICD-10-CM | POA: Diagnosis not present

## 2016-04-23 DIAGNOSIS — Z79899 Other long term (current) drug therapy: Secondary | ICD-10-CM | POA: Diagnosis not present

## 2016-04-23 DIAGNOSIS — I480 Paroxysmal atrial fibrillation: Secondary | ICD-10-CM | POA: Diagnosis not present

## 2016-04-23 DIAGNOSIS — J40 Bronchitis, not specified as acute or chronic: Secondary | ICD-10-CM | POA: Diagnosis not present

## 2016-04-23 DIAGNOSIS — J4520 Mild intermittent asthma, uncomplicated: Secondary | ICD-10-CM | POA: Diagnosis not present

## 2016-04-23 DIAGNOSIS — Z1159 Encounter for screening for other viral diseases: Secondary | ICD-10-CM | POA: Diagnosis not present

## 2016-04-23 DIAGNOSIS — I10 Essential (primary) hypertension: Secondary | ICD-10-CM | POA: Diagnosis not present

## 2016-04-23 DIAGNOSIS — N400 Enlarged prostate without lower urinary tract symptoms: Secondary | ICD-10-CM | POA: Diagnosis not present

## 2016-04-23 DIAGNOSIS — E782 Mixed hyperlipidemia: Secondary | ICD-10-CM | POA: Diagnosis not present

## 2016-05-30 DIAGNOSIS — Z8701 Personal history of pneumonia (recurrent): Secondary | ICD-10-CM | POA: Diagnosis not present

## 2016-05-30 DIAGNOSIS — R0789 Other chest pain: Secondary | ICD-10-CM | POA: Diagnosis not present

## 2016-05-30 IMAGING — DX DG HAND COMPLETE 3+V*R*
3 series · 3 of 3 positions shown · non-contrast
Comparison: None.

CLINICAL DATA: Slammed hand between worse and trailer. Right hand
injury with pain and swelling. Initial encounter.

EXAM:
RIGHT HAND - COMPLETE 3+ VIEW

[hand ap]
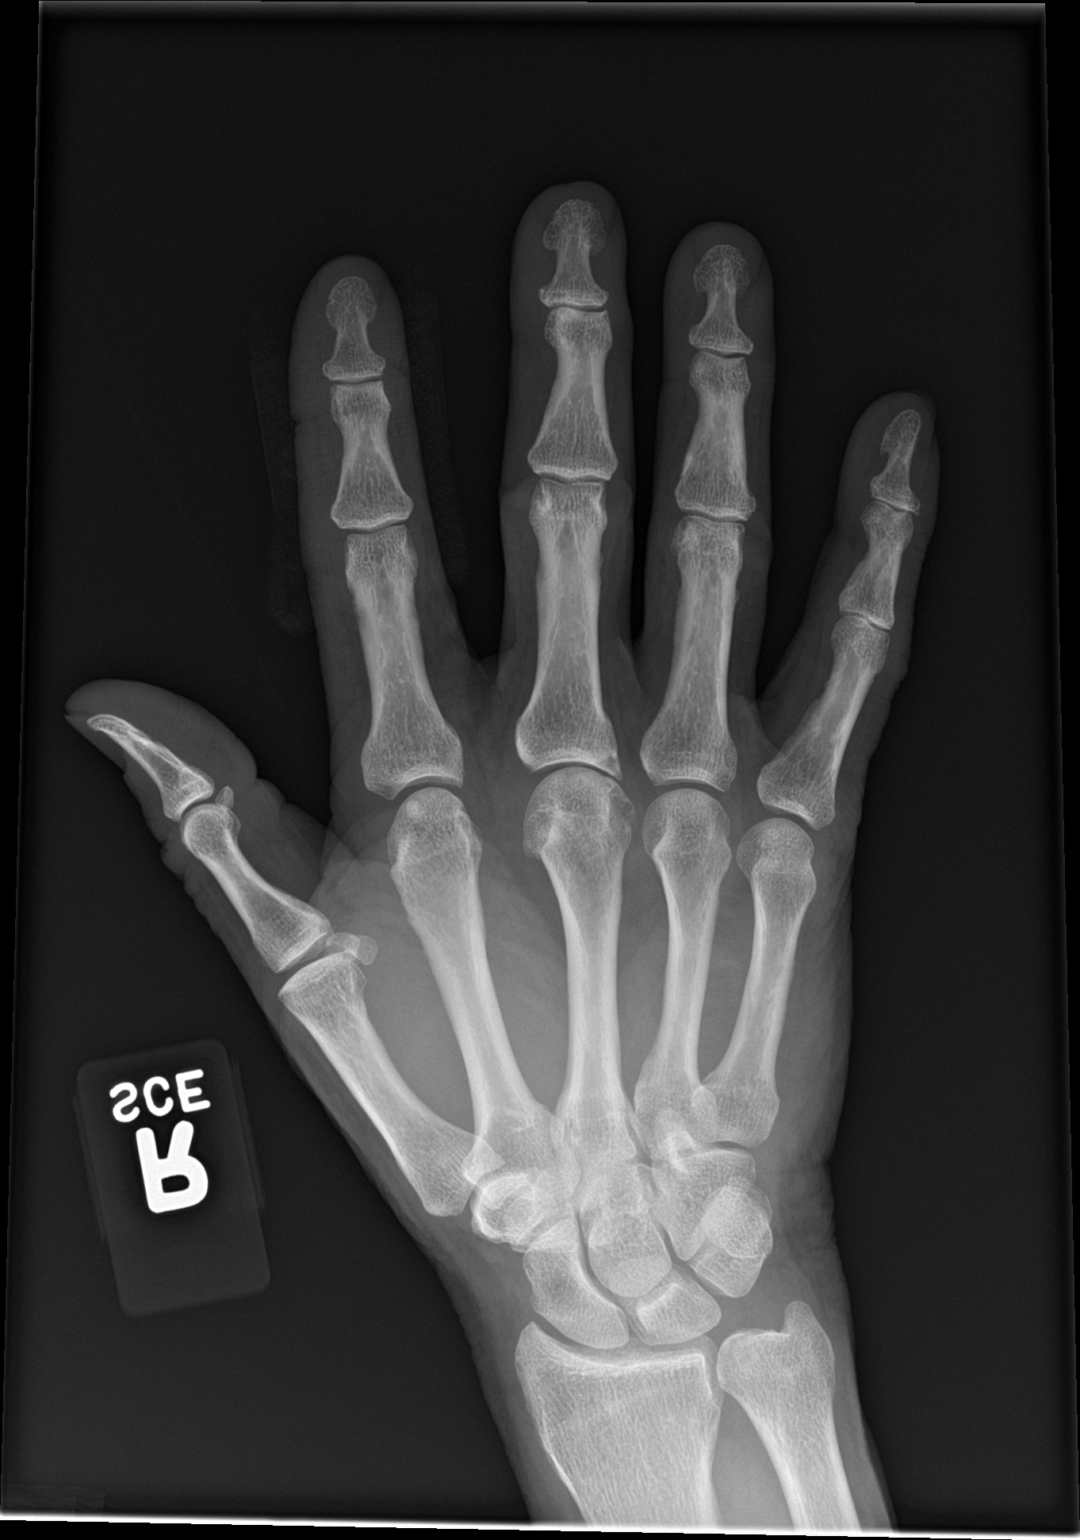

[hand obl]
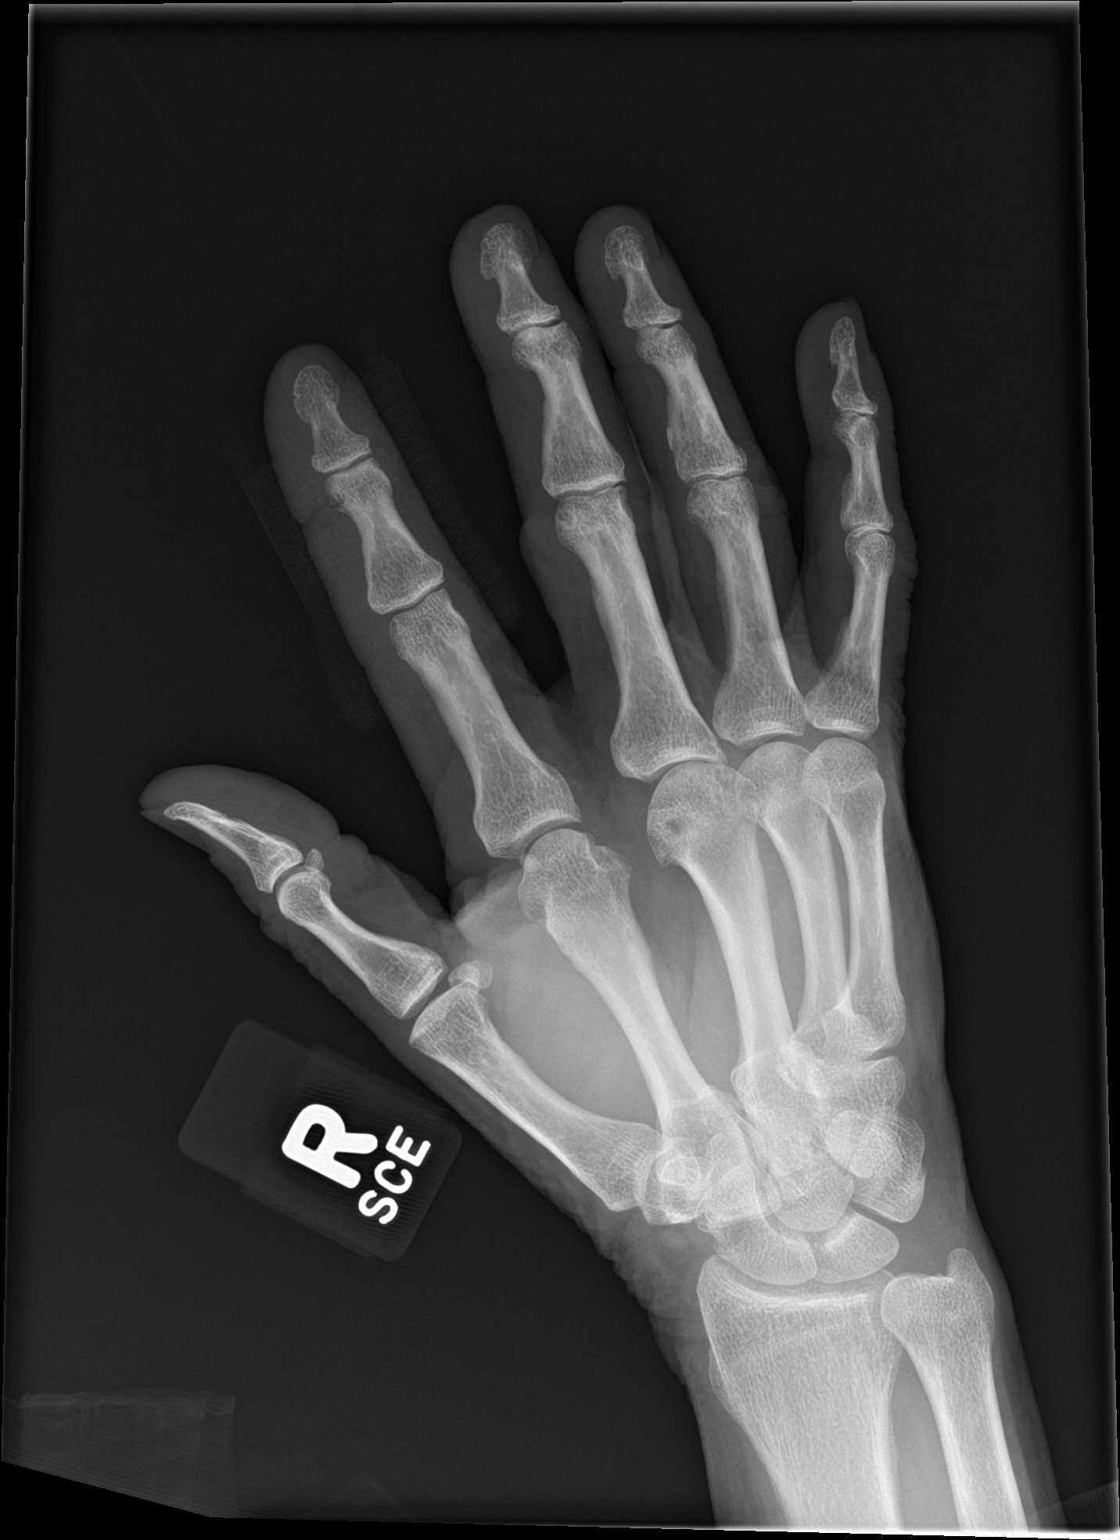

[hand lat]
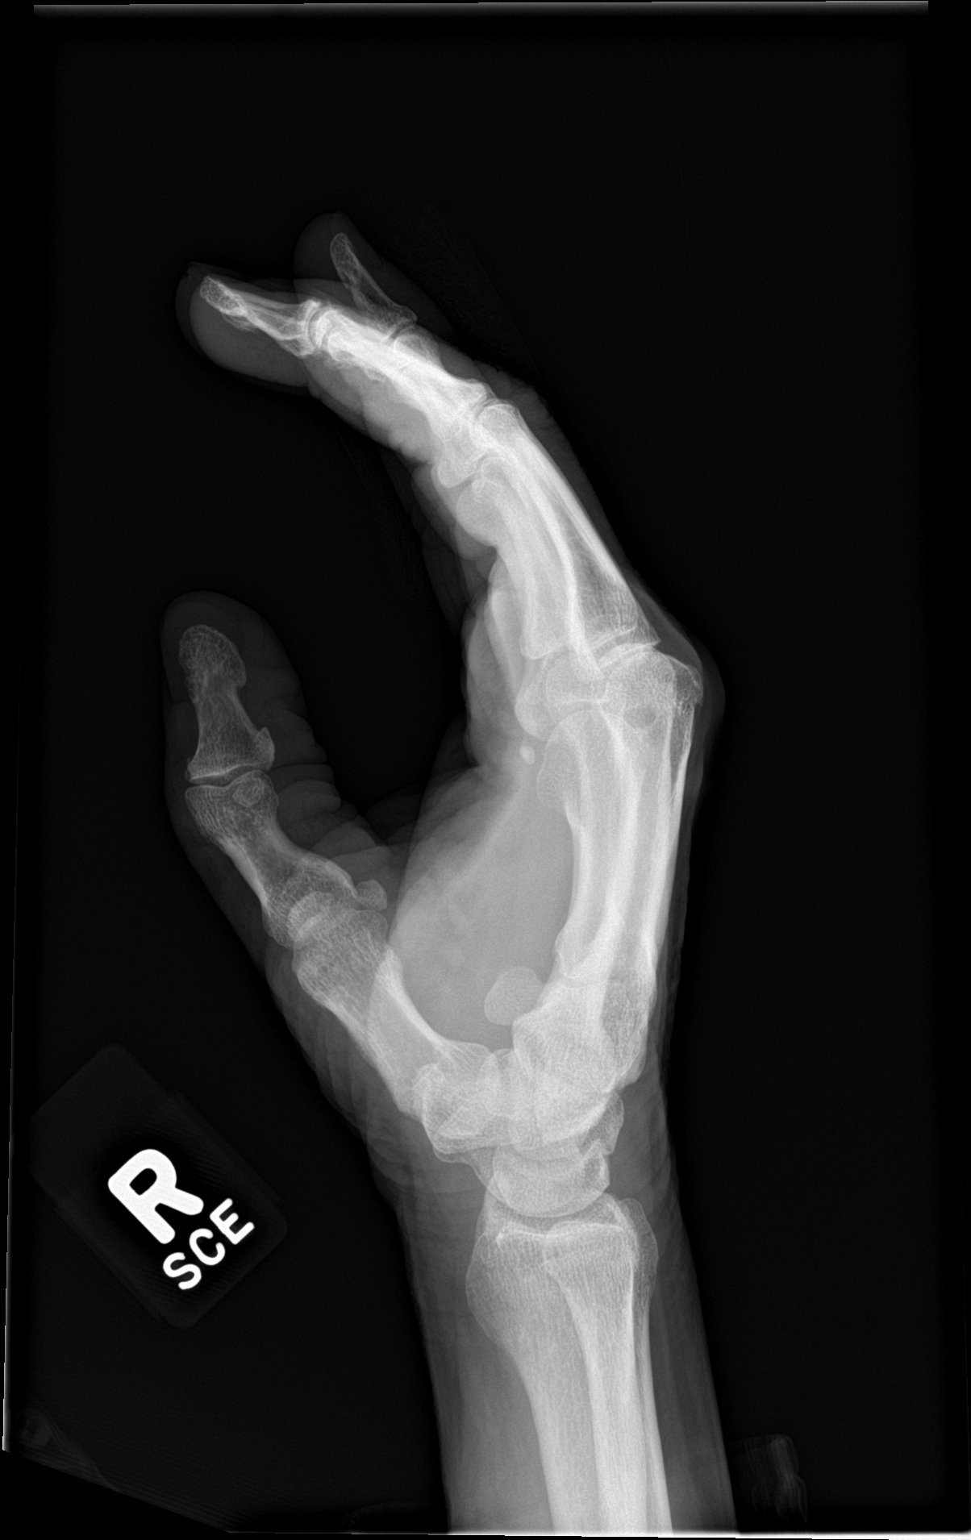

[3 of 3 positions shown; findings below may reference images not displayed]

FINDINGS: There is no evidence of fracture or dislocation. Mild to moderate
osteoarthritis is seen involving the third MCP joint. No other
significant bone abnormality identified.
IMPRESSION: No acute findings.

Osteoarthritis of third MCP joint.

## 2016-09-03 DIAGNOSIS — R51 Headache: Secondary | ICD-10-CM | POA: Diagnosis not present

## 2016-09-03 DIAGNOSIS — W57XXXA Bitten or stung by nonvenomous insect and other nonvenomous arthropods, initial encounter: Secondary | ICD-10-CM | POA: Diagnosis not present

## 2016-09-03 DIAGNOSIS — J00 Acute nasopharyngitis [common cold]: Secondary | ICD-10-CM | POA: Diagnosis not present

## 2016-11-01 DIAGNOSIS — N400 Enlarged prostate without lower urinary tract symptoms: Secondary | ICD-10-CM | POA: Diagnosis not present

## 2016-11-01 DIAGNOSIS — I10 Essential (primary) hypertension: Secondary | ICD-10-CM | POA: Diagnosis not present

## 2016-11-01 DIAGNOSIS — Z79899 Other long term (current) drug therapy: Secondary | ICD-10-CM | POA: Diagnosis not present

## 2016-11-01 DIAGNOSIS — J4520 Mild intermittent asthma, uncomplicated: Secondary | ICD-10-CM | POA: Diagnosis not present

## 2016-11-01 DIAGNOSIS — E782 Mixed hyperlipidemia: Secondary | ICD-10-CM | POA: Diagnosis not present

## 2017-05-09 DIAGNOSIS — R7302 Impaired glucose tolerance (oral): Secondary | ICD-10-CM | POA: Diagnosis not present

## 2017-05-09 DIAGNOSIS — Z79899 Other long term (current) drug therapy: Secondary | ICD-10-CM | POA: Diagnosis not present

## 2017-05-09 DIAGNOSIS — Z23 Encounter for immunization: Secondary | ICD-10-CM | POA: Diagnosis not present

## 2017-05-09 DIAGNOSIS — E782 Mixed hyperlipidemia: Secondary | ICD-10-CM | POA: Diagnosis not present

## 2017-05-09 DIAGNOSIS — J4520 Mild intermittent asthma, uncomplicated: Secondary | ICD-10-CM | POA: Diagnosis not present

## 2017-05-09 DIAGNOSIS — I10 Essential (primary) hypertension: Secondary | ICD-10-CM | POA: Diagnosis not present

## 2017-05-09 DIAGNOSIS — Z125 Encounter for screening for malignant neoplasm of prostate: Secondary | ICD-10-CM | POA: Diagnosis not present

## 2017-05-09 DIAGNOSIS — N400 Enlarged prostate without lower urinary tract symptoms: Secondary | ICD-10-CM | POA: Diagnosis not present

## 2017-07-04 DIAGNOSIS — J343 Hypertrophy of nasal turbinates: Secondary | ICD-10-CM | POA: Diagnosis not present

## 2017-07-04 DIAGNOSIS — J342 Deviated nasal septum: Secondary | ICD-10-CM | POA: Diagnosis not present

## 2017-07-04 DIAGNOSIS — J329 Chronic sinusitis, unspecified: Secondary | ICD-10-CM | POA: Diagnosis not present

## 2017-07-04 DIAGNOSIS — J339 Nasal polyp, unspecified: Secondary | ICD-10-CM | POA: Diagnosis not present

## 2017-07-18 DIAGNOSIS — J301 Allergic rhinitis due to pollen: Secondary | ICD-10-CM | POA: Diagnosis not present

## 2017-07-18 DIAGNOSIS — J339 Nasal polyp, unspecified: Secondary | ICD-10-CM | POA: Diagnosis not present

## 2017-07-18 DIAGNOSIS — J329 Chronic sinusitis, unspecified: Secondary | ICD-10-CM | POA: Diagnosis not present

## 2017-07-18 DIAGNOSIS — J343 Hypertrophy of nasal turbinates: Secondary | ICD-10-CM | POA: Diagnosis not present

## 2017-09-05 DIAGNOSIS — I480 Paroxysmal atrial fibrillation: Secondary | ICD-10-CM | POA: Diagnosis not present

## 2017-09-05 DIAGNOSIS — R001 Bradycardia, unspecified: Secondary | ICD-10-CM | POA: Diagnosis not present

## 2017-09-05 DIAGNOSIS — I2510 Atherosclerotic heart disease of native coronary artery without angina pectoris: Secondary | ICD-10-CM | POA: Diagnosis not present

## 2017-09-05 DIAGNOSIS — E7800 Pure hypercholesterolemia, unspecified: Secondary | ICD-10-CM | POA: Diagnosis not present

## 2017-09-05 DIAGNOSIS — I10 Essential (primary) hypertension: Secondary | ICD-10-CM | POA: Diagnosis not present

## 2017-11-07 DIAGNOSIS — N400 Enlarged prostate without lower urinary tract symptoms: Secondary | ICD-10-CM | POA: Diagnosis not present

## 2017-11-07 DIAGNOSIS — Z79899 Other long term (current) drug therapy: Secondary | ICD-10-CM | POA: Diagnosis not present

## 2017-11-07 DIAGNOSIS — J4520 Mild intermittent asthma, uncomplicated: Secondary | ICD-10-CM | POA: Diagnosis not present

## 2017-11-07 DIAGNOSIS — E782 Mixed hyperlipidemia: Secondary | ICD-10-CM | POA: Diagnosis not present

## 2017-11-07 DIAGNOSIS — I10 Essential (primary) hypertension: Secondary | ICD-10-CM | POA: Diagnosis not present

## 2017-11-07 DIAGNOSIS — R7302 Impaired glucose tolerance (oral): Secondary | ICD-10-CM | POA: Diagnosis not present

## 2017-11-07 DIAGNOSIS — R1032 Left lower quadrant pain: Secondary | ICD-10-CM | POA: Diagnosis not present

## 2017-11-21 DIAGNOSIS — D23111 Other benign neoplasm of skin of right upper eyelid, including canthus: Secondary | ICD-10-CM | POA: Diagnosis not present

## 2018-01-08 DIAGNOSIS — Z79899 Other long term (current) drug therapy: Secondary | ICD-10-CM | POA: Diagnosis not present

## 2018-01-08 DIAGNOSIS — I10 Essential (primary) hypertension: Secondary | ICD-10-CM | POA: Diagnosis not present

## 2018-01-08 DIAGNOSIS — I480 Paroxysmal atrial fibrillation: Secondary | ICD-10-CM | POA: Diagnosis not present

## 2018-01-08 DIAGNOSIS — I2510 Atherosclerotic heart disease of native coronary artery without angina pectoris: Secondary | ICD-10-CM | POA: Diagnosis not present

## 2018-01-30 DIAGNOSIS — I480 Paroxysmal atrial fibrillation: Secondary | ICD-10-CM | POA: Diagnosis not present

## 2018-01-30 DIAGNOSIS — I10 Essential (primary) hypertension: Secondary | ICD-10-CM | POA: Diagnosis not present

## 2018-01-30 DIAGNOSIS — E7800 Pure hypercholesterolemia, unspecified: Secondary | ICD-10-CM | POA: Diagnosis not present

## 2018-01-30 DIAGNOSIS — I2510 Atherosclerotic heart disease of native coronary artery without angina pectoris: Secondary | ICD-10-CM | POA: Diagnosis not present

## 2018-05-12 DIAGNOSIS — I480 Paroxysmal atrial fibrillation: Secondary | ICD-10-CM | POA: Diagnosis not present

## 2018-05-12 DIAGNOSIS — K5792 Diverticulitis of intestine, part unspecified, without perforation or abscess without bleeding: Secondary | ICD-10-CM | POA: Diagnosis not present

## 2018-05-12 DIAGNOSIS — I10 Essential (primary) hypertension: Secondary | ICD-10-CM | POA: Diagnosis not present

## 2018-05-15 DIAGNOSIS — I10 Essential (primary) hypertension: Secondary | ICD-10-CM | POA: Diagnosis not present

## 2018-05-15 DIAGNOSIS — I2510 Atherosclerotic heart disease of native coronary artery without angina pectoris: Secondary | ICD-10-CM | POA: Diagnosis not present

## 2018-05-15 DIAGNOSIS — E7800 Pure hypercholesterolemia, unspecified: Secondary | ICD-10-CM | POA: Diagnosis not present

## 2018-05-15 DIAGNOSIS — I480 Paroxysmal atrial fibrillation: Secondary | ICD-10-CM | POA: Diagnosis not present

## 2018-05-22 DIAGNOSIS — J4520 Mild intermittent asthma, uncomplicated: Secondary | ICD-10-CM | POA: Diagnosis not present

## 2018-05-22 DIAGNOSIS — I10 Essential (primary) hypertension: Secondary | ICD-10-CM | POA: Diagnosis not present

## 2018-05-22 DIAGNOSIS — I2510 Atherosclerotic heart disease of native coronary artery without angina pectoris: Secondary | ICD-10-CM | POA: Diagnosis not present

## 2018-05-22 DIAGNOSIS — Z125 Encounter for screening for malignant neoplasm of prostate: Secondary | ICD-10-CM | POA: Diagnosis not present

## 2018-05-22 DIAGNOSIS — E782 Mixed hyperlipidemia: Secondary | ICD-10-CM | POA: Diagnosis not present

## 2018-05-22 DIAGNOSIS — N400 Enlarged prostate without lower urinary tract symptoms: Secondary | ICD-10-CM | POA: Diagnosis not present

## 2018-05-22 DIAGNOSIS — Z0000 Encounter for general adult medical examination without abnormal findings: Secondary | ICD-10-CM | POA: Diagnosis not present

## 2018-05-22 DIAGNOSIS — Z79899 Other long term (current) drug therapy: Secondary | ICD-10-CM | POA: Diagnosis not present

## 2018-05-22 DIAGNOSIS — E039 Hypothyroidism, unspecified: Secondary | ICD-10-CM | POA: Diagnosis not present

## 2018-05-22 DIAGNOSIS — I480 Paroxysmal atrial fibrillation: Secondary | ICD-10-CM | POA: Diagnosis not present

## 2018-05-22 DIAGNOSIS — R7302 Impaired glucose tolerance (oral): Secondary | ICD-10-CM | POA: Diagnosis not present

## 2018-05-22 DIAGNOSIS — Z23 Encounter for immunization: Secondary | ICD-10-CM | POA: Diagnosis not present

## 2018-06-10 DIAGNOSIS — I2510 Atherosclerotic heart disease of native coronary artery without angina pectoris: Secondary | ICD-10-CM | POA: Diagnosis not present

## 2018-06-10 DIAGNOSIS — I480 Paroxysmal atrial fibrillation: Secondary | ICD-10-CM | POA: Diagnosis not present

## 2018-06-24 DIAGNOSIS — I480 Paroxysmal atrial fibrillation: Secondary | ICD-10-CM | POA: Diagnosis not present

## 2018-06-24 DIAGNOSIS — I2510 Atherosclerotic heart disease of native coronary artery without angina pectoris: Secondary | ICD-10-CM | POA: Diagnosis not present

## 2018-06-24 DIAGNOSIS — E7800 Pure hypercholesterolemia, unspecified: Secondary | ICD-10-CM | POA: Diagnosis not present

## 2018-06-24 DIAGNOSIS — I10 Essential (primary) hypertension: Secondary | ICD-10-CM | POA: Diagnosis not present

## 2018-09-18 DIAGNOSIS — J339 Nasal polyp, unspecified: Secondary | ICD-10-CM | POA: Diagnosis not present

## 2018-09-18 DIAGNOSIS — J3489 Other specified disorders of nose and nasal sinuses: Secondary | ICD-10-CM | POA: Diagnosis not present

## 2018-09-18 DIAGNOSIS — J342 Deviated nasal septum: Secondary | ICD-10-CM | POA: Diagnosis not present

## 2018-09-18 DIAGNOSIS — J301 Allergic rhinitis due to pollen: Secondary | ICD-10-CM | POA: Diagnosis not present

## 2018-11-20 DIAGNOSIS — I2510 Atherosclerotic heart disease of native coronary artery without angina pectoris: Secondary | ICD-10-CM | POA: Diagnosis not present

## 2018-11-20 DIAGNOSIS — R7302 Impaired glucose tolerance (oral): Secondary | ICD-10-CM | POA: Diagnosis not present

## 2018-11-20 DIAGNOSIS — J4520 Mild intermittent asthma, uncomplicated: Secondary | ICD-10-CM | POA: Diagnosis not present

## 2018-11-20 DIAGNOSIS — Z79899 Other long term (current) drug therapy: Secondary | ICD-10-CM | POA: Diagnosis not present

## 2018-11-20 DIAGNOSIS — E039 Hypothyroidism, unspecified: Secondary | ICD-10-CM | POA: Diagnosis not present

## 2018-11-20 DIAGNOSIS — I480 Paroxysmal atrial fibrillation: Secondary | ICD-10-CM | POA: Diagnosis not present

## 2018-11-20 DIAGNOSIS — I10 Essential (primary) hypertension: Secondary | ICD-10-CM | POA: Diagnosis not present

## 2018-11-20 DIAGNOSIS — Z0000 Encounter for general adult medical examination without abnormal findings: Secondary | ICD-10-CM | POA: Diagnosis not present

## 2018-11-20 DIAGNOSIS — E782 Mixed hyperlipidemia: Secondary | ICD-10-CM | POA: Diagnosis not present

## 2018-11-20 DIAGNOSIS — N400 Enlarged prostate without lower urinary tract symptoms: Secondary | ICD-10-CM | POA: Diagnosis not present

## 2018-12-07 DIAGNOSIS — E7800 Pure hypercholesterolemia, unspecified: Secondary | ICD-10-CM | POA: Diagnosis not present

## 2018-12-07 DIAGNOSIS — I480 Paroxysmal atrial fibrillation: Secondary | ICD-10-CM | POA: Diagnosis not present

## 2018-12-07 DIAGNOSIS — I10 Essential (primary) hypertension: Secondary | ICD-10-CM | POA: Diagnosis not present

## 2019-01-20 DIAGNOSIS — R310 Gross hematuria: Secondary | ICD-10-CM | POA: Diagnosis not present

## 2019-02-19 DIAGNOSIS — H2513 Age-related nuclear cataract, bilateral: Secondary | ICD-10-CM | POA: Diagnosis not present

## 2019-04-29 ENCOUNTER — Ambulatory Visit: Payer: PPO | Attending: Internal Medicine

## 2019-04-29 DIAGNOSIS — Z20822 Contact with and (suspected) exposure to covid-19: Secondary | ICD-10-CM

## 2019-05-05 LAB — NOVEL CORONAVIRUS, NAA

## 2019-05-07 ENCOUNTER — Other Ambulatory Visit: Payer: Self-pay

## 2019-05-07 ENCOUNTER — Ambulatory Visit: Payer: PPO | Attending: Family

## 2019-05-07 DIAGNOSIS — Z20822 Contact with and (suspected) exposure to covid-19: Secondary | ICD-10-CM

## 2019-05-09 LAB — NOVEL CORONAVIRUS, NAA: SARS-CoV-2, NAA: NOT DETECTED

## 2019-05-28 DIAGNOSIS — I480 Paroxysmal atrial fibrillation: Secondary | ICD-10-CM | POA: Diagnosis not present

## 2019-05-28 DIAGNOSIS — N400 Enlarged prostate without lower urinary tract symptoms: Secondary | ICD-10-CM | POA: Diagnosis not present

## 2019-05-28 DIAGNOSIS — I2510 Atherosclerotic heart disease of native coronary artery without angina pectoris: Secondary | ICD-10-CM | POA: Diagnosis not present

## 2019-05-28 DIAGNOSIS — I10 Essential (primary) hypertension: Secondary | ICD-10-CM | POA: Diagnosis not present

## 2019-05-28 DIAGNOSIS — R7302 Impaired glucose tolerance (oral): Secondary | ICD-10-CM | POA: Diagnosis not present

## 2019-05-28 DIAGNOSIS — J4520 Mild intermittent asthma, uncomplicated: Secondary | ICD-10-CM | POA: Diagnosis not present

## 2019-05-28 DIAGNOSIS — Z79899 Other long term (current) drug therapy: Secondary | ICD-10-CM | POA: Diagnosis not present

## 2019-05-28 DIAGNOSIS — E039 Hypothyroidism, unspecified: Secondary | ICD-10-CM | POA: Diagnosis not present

## 2019-05-28 DIAGNOSIS — E782 Mixed hyperlipidemia: Secondary | ICD-10-CM | POA: Diagnosis not present

## 2019-06-09 DIAGNOSIS — E7800 Pure hypercholesterolemia, unspecified: Secondary | ICD-10-CM | POA: Diagnosis not present

## 2019-06-09 DIAGNOSIS — I10 Essential (primary) hypertension: Secondary | ICD-10-CM | POA: Diagnosis not present

## 2019-06-09 DIAGNOSIS — I480 Paroxysmal atrial fibrillation: Secondary | ICD-10-CM | POA: Diagnosis not present

## 2019-06-09 DIAGNOSIS — I2510 Atherosclerotic heart disease of native coronary artery without angina pectoris: Secondary | ICD-10-CM | POA: Diagnosis not present

## 2019-06-25 DIAGNOSIS — M25562 Pain in left knee: Secondary | ICD-10-CM | POA: Diagnosis not present

## 2019-06-25 DIAGNOSIS — J4520 Mild intermittent asthma, uncomplicated: Secondary | ICD-10-CM | POA: Diagnosis not present

## 2019-07-15 ENCOUNTER — Other Ambulatory Visit: Payer: Self-pay | Admitting: Nurse Practitioner

## 2019-07-15 ENCOUNTER — Other Ambulatory Visit: Payer: Self-pay

## 2019-07-15 ENCOUNTER — Ambulatory Visit
Admission: RE | Admit: 2019-07-15 | Discharge: 2019-07-15 | Disposition: A | Discharge status: Home / Self Care | Payer: PPO | Source: Ambulatory Visit | Attending: Nurse Practitioner | Admitting: Nurse Practitioner

## 2019-07-15 DIAGNOSIS — R600 Localized edema: Secondary | ICD-10-CM | POA: Diagnosis not present

## 2019-07-15 DIAGNOSIS — M79662 Pain in left lower leg: Secondary | ICD-10-CM | POA: Insufficient documentation

## 2019-10-08 DIAGNOSIS — D23112 Other benign neoplasm of skin of right lower eyelid, including canthus: Secondary | ICD-10-CM | POA: Diagnosis not present

## 2019-11-04 DIAGNOSIS — J339 Nasal polyp, unspecified: Secondary | ICD-10-CM | POA: Diagnosis not present

## 2019-11-04 DIAGNOSIS — J3489 Other specified disorders of nose and nasal sinuses: Secondary | ICD-10-CM | POA: Diagnosis not present

## 2019-12-31 DIAGNOSIS — N400 Enlarged prostate without lower urinary tract symptoms: Secondary | ICD-10-CM | POA: Diagnosis not present

## 2019-12-31 DIAGNOSIS — I10 Essential (primary) hypertension: Secondary | ICD-10-CM | POA: Diagnosis not present

## 2019-12-31 DIAGNOSIS — I480 Paroxysmal atrial fibrillation: Secondary | ICD-10-CM | POA: Diagnosis not present

## 2019-12-31 DIAGNOSIS — J4520 Mild intermittent asthma, uncomplicated: Secondary | ICD-10-CM | POA: Diagnosis not present

## 2019-12-31 DIAGNOSIS — I2510 Atherosclerotic heart disease of native coronary artery without angina pectoris: Secondary | ICD-10-CM | POA: Diagnosis not present

## 2019-12-31 DIAGNOSIS — Z0000 Encounter for general adult medical examination without abnormal findings: Secondary | ICD-10-CM | POA: Diagnosis not present

## 2019-12-31 DIAGNOSIS — R7302 Impaired glucose tolerance (oral): Secondary | ICD-10-CM | POA: Diagnosis not present

## 2019-12-31 DIAGNOSIS — E782 Mixed hyperlipidemia: Secondary | ICD-10-CM | POA: Diagnosis not present

## 2019-12-31 DIAGNOSIS — E039 Hypothyroidism, unspecified: Secondary | ICD-10-CM | POA: Diagnosis not present

## 2020-02-02 ENCOUNTER — Ambulatory Visit
Admission: EM | Admit: 2020-02-02 | Discharge: 2020-02-02 | Disposition: A | Discharge status: Home / Self Care | Payer: PPO | Attending: Family Medicine | Admitting: Family Medicine

## 2020-02-02 ENCOUNTER — Other Ambulatory Visit: Payer: Self-pay

## 2020-02-02 DIAGNOSIS — K5732 Diverticulitis of large intestine without perforation or abscess without bleeding: Secondary | ICD-10-CM | POA: Diagnosis not present

## 2020-02-02 MED ORDER — CIPROFLOXACIN HCL 500 MG PO TABS: 500.0000 mg | ORAL_TABLET | Freq: Two times a day (BID) | ORAL | 0 refills | Status: AC

## 2020-02-02 MED ORDER — METRONIDAZOLE 500 MG PO TABS: 500.0000 mg | ORAL_TABLET | Freq: Three times a day (TID) | ORAL | 0 refills | Status: AC

## 2020-02-02 NOTE — ED Triage Notes (Signed)
Pt has hx of diverticulitis and is not supposed to eat tomatoes or seeds.  Had both about 5 days ago and started having abd pain two days ago.  Pain across lower abdomen. Denies fever, diarrhea, vomiting.

## 2020-02-02 NOTE — ED Provider Notes (Signed)
[facility]    CSN: [id number] Arrival date & time: 02/02/20  0846  History   Chief Complaint Chief Complaint  Patient presents with  . Abdominal Pain   HPI 74 year old male presents with left lower quadrant pain.  Patient states he has a history of diverticulitis.  Reports 3-day history of left lower quadrant pain.  No nausea or vomiting.  No diarrhea.  He states that this feels similar to his prior bouts of diverticulitis.  No relieving factors.  He believes that this was brought on by something he ate.  Pain 8/10 in severity.  No other associated symptoms.  No other complaints.  Past Medical History:  Diagnosis Date  . Arthritis   . Asthma   . CAD (coronary artery disease)   . Diverticulitis   . Diverticulosis   . Hyperlipemia   . Hypertension   . IGT (impaired glucose tolerance)   . Rectal bleeding     Patient Active Problem List   Diagnosis Date Noted  . Airway hyperreactivity 01/06/2015  . Hypercholesteremia 01/06/2015  . CAD (coronary artery disease) 12/31/2014  . IGT (impaired glucose tolerance) 12/31/2014  . Lower GI bleed 12/31/2014  . Hypotension 12/27/2014  . Essential hypertension 12/27/2014  . BPH (benign prostatic hypertrophy) with urinary retention 12/27/2014  . Diverticular hemorrhage 12/27/2014  . Hypomagnesemia 12/25/2014  . Chronic sinusitis 12/25/2014  . Rectal bleed 12/24/2014  . Acute lower GI bleeding   . Poor venous access   . GI bleed 12/23/2014    Past Surgical History:  Procedure Laterality Date  . COLONOSCOPY WITH PROPOFOL N/A 02/10/2015   Procedure: COLONOSCOPY WITH PROPOFOL;  Surgeon: Lollie Sails, MD;  Location: [facility] ENDOSCOPY;  Service: Endoscopy;  Laterality: N/A;  . COLONOSCOPY WITH PROPOFOL N/A 02/13/2015   Procedure: COLONOSCOPY WITH PROPOFOL;  Surgeon: Lollie Sails, MD;  Location: [facility] ENDOSCOPY;  Service: Endoscopy;  Laterality: N/A;  . HERNIA REPAIR         Home Medications    Prior to  Admission medications   Medication Sig Start Date End Date Taking? Authorizing Provider  amLODipine (NORVASC) 5 MG tablet Take 5 mg by mouth daily.   Yes [provider]  finasteride (PROSCAR) 5 MG tablet Take 5 mg by mouth daily.   Yes [provider]  Fluticasone-Salmeterol (ADVAIR) 100-50 MCG/DOSE AEPB Inhale 1 puff into the lungs 2 (two) times daily as needed (for wheezing or shortness of breath).    Yes [provider]  lisinopril-hydrochlorothiazide (PRINZIDE,ZESTORETIC) 20-25 MG per tablet Take 1 tablet by mouth daily.   Yes [provider]  lovastatin (MEVACOR) 20 MG tablet Take 20 mg by mouth daily.    Yes [provider]  Multiple Vitamin (MULTIVITAMIN WITH MINERALS) TABS tablet Take 1 tablet by mouth every Monday, Wednesday, and Friday.   Yes [provider]  tamsulosin (FLOMAX) 0.4 MG CAPS capsule TAKE ONE CAPSULE BY MOUTH ONCE DAILY AFTER SUPPER 02/21/15  Yes Roda Shutters, FNP  ciprofloxacin (CIPRO) 500 MG tablet Take 1 tablet (500 mg total) by mouth 2 (two) times daily for 7 days. 02/02/20 02/09/20  Coral Spikes, DO  metroNIDAZOLE (FLAGYL) 500 MG tablet Take 1 tablet (500 mg total) by mouth 3 (three) times daily for 7 days. 02/02/20 02/09/20  Coral Spikes, DO    Family History Family History  Problem Relation Age of Onset  . Hypertension Father   . CAD Father   . Stroke Mother     Social History  Social History   Tobacco Use  . Smoking status: Former Smoker    Packs/day: 1.00    Years: 15.00    Pack years: 15.00  . Smokeless tobacco: Former Systems developer    Quit date: 12/31/1983  Substance Use Topics  . Alcohol use: No  . Drug use: No     Allergies   Patient has no known allergies.   Review of Systems Review of Systems  Constitutional: Negative for fever.  Gastrointestinal: Positive for abdominal pain. Negative for diarrhea, nausea and vomiting.   Physical Exam Triage Vital Signs ED Triage Vitals  Enc  Vitals Group     BP 02/02/20 0914 (!) 161/88     Pulse Rate 02/02/20 0914 96     Resp 02/02/20 0914 18     Temp --      Temp Source 02/02/20 0914 Oral     SpO2 02/02/20 0914 96 %     Weight --      Height --      Head Circumference --      Peak Flow --      Pain Score 02/02/20 0917 8     Pain Loc --      Pain Edu? --      Excl. in [place]? --    Updated Vital Signs BP (!) 161/88 (BP Location: Left Arm)   Pulse 96   Resp 18   SpO2 98%   Visual Acuity Right Eye Distance:   Left Eye Distance:   Bilateral Distance:    Right Eye Near:   Left Eye Near:    Bilateral Near:     Physical Exam Vitals and nursing note reviewed.  Constitutional:      General: He is not in acute distress.    Appearance: Normal appearance. He is not ill-appearing.  HENT:     Head: Normocephalic and atraumatic.  Eyes:     General:        Right eye: No discharge.        Left eye: No discharge.     Conjunctiva/sclera: Conjunctivae normal.  Cardiovascular:     Rate and Rhythm: Normal rate and regular rhythm.     Heart sounds: No murmur heard.   Pulmonary:     Effort: Pulmonary effort is normal.     Breath sounds: Normal breath sounds. No wheezing, rhonchi or rales.  Abdominal:     Comments: Soft, nondistended.  Tender to palpation left lower quadrant.  Neurological:     Mental Status: He is alert.  Psychiatric:        Mood and Affect: Mood normal.        Behavior: Behavior normal.    UC Treatments / Results  Labs (all labs ordered are listed, but only abnormal results are displayed) Labs Reviewed - No data to display  EKG   Radiology No results found.  Procedures Procedures (including critical care time)  Medications Ordered in UC Medications - No data to display  Initial Impression / Assessment and Plan / UC Course  I have reviewed the triage vital signs and the nursing notes.  Pertinent labs & imaging results that were available during my care of the patient were reviewed by  me and considered in my medical decision making (see chart for details).    74 year old male presents with diverticulitis.  Treating with Cipro and Flagyl.  Final Clinical Impressions(s) / UC Diagnoses   Final diagnoses:  Diverticulitis of colon   Discharge Instructions   None  ED Prescriptions    Medication Sig Dispense Auth. Provider   ciprofloxacin (CIPRO) 500 MG tablet Take 1 tablet (500 mg total) by mouth 2 (two) times daily for 7 days. 14 tablet Shakeda Pearse G, DO   metroNIDAZOLE (FLAGYL) 500 MG tablet Take 1 tablet (500 mg total) by mouth 3 (three) times daily for 7 days. 21 tablet Thersa Salt G, DO     PDMP not reviewed this encounter.   Thersa Salt [place], [place] 02/02/20 [phone]

## 2020-03-16 DIAGNOSIS — I2510 Atherosclerotic heart disease of native coronary artery without angina pectoris: Secondary | ICD-10-CM | POA: Diagnosis not present

## 2020-03-16 DIAGNOSIS — I480 Paroxysmal atrial fibrillation: Secondary | ICD-10-CM | POA: Diagnosis not present

## 2020-03-16 DIAGNOSIS — I10 Essential (primary) hypertension: Secondary | ICD-10-CM | POA: Diagnosis not present

## 2020-03-16 DIAGNOSIS — E7800 Pure hypercholesterolemia, unspecified: Secondary | ICD-10-CM | POA: Diagnosis not present

## 2020-05-09 DIAGNOSIS — J069 Acute upper respiratory infection, unspecified: Secondary | ICD-10-CM | POA: Diagnosis not present

## 2020-06-13 IMAGING — US US EXTREM LOW VENOUS*L*
1 series · 14 of 24 positions shown · non-contrast
Comparison: No recent prior.

CLINICAL DATA: Left calf pain.  Leg edema.

EXAM:
LEFT LOWER EXTREMITY VENOUS DOPPLER ULTRASOUND
TECHNIQUE: Gray-scale sonography with compression, as well as color and duplex
ultrasound, were performed to evaluate the deep venous system(s)
from the level of the common femoral vein through the popliteal and
proximal calf veins.

[Series 1: us extrem low venous*left* · 0.08mm/px · 14 of 34 slices shown]
[im 1/34]
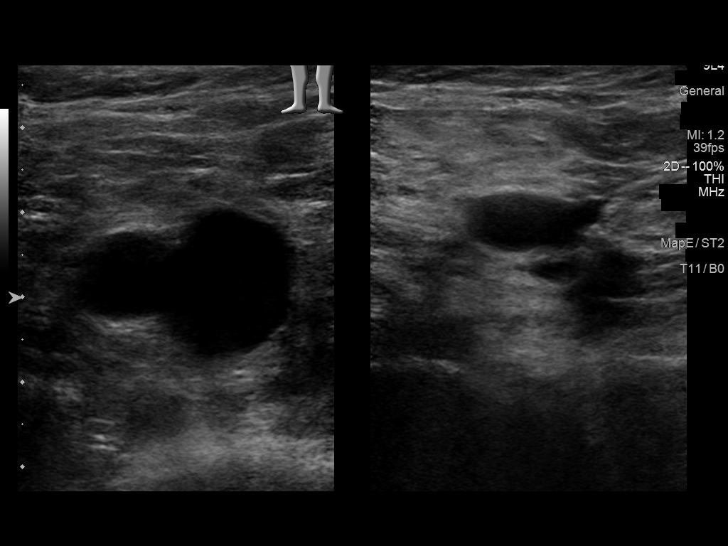
[im 3/34]
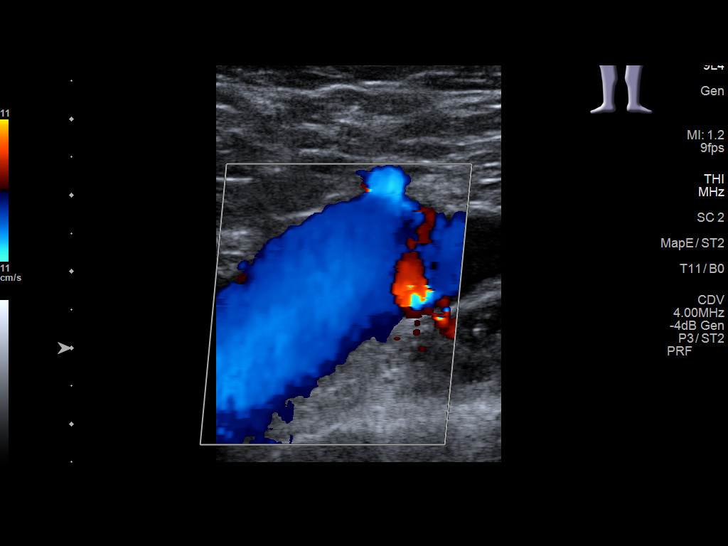
[im 6/34]
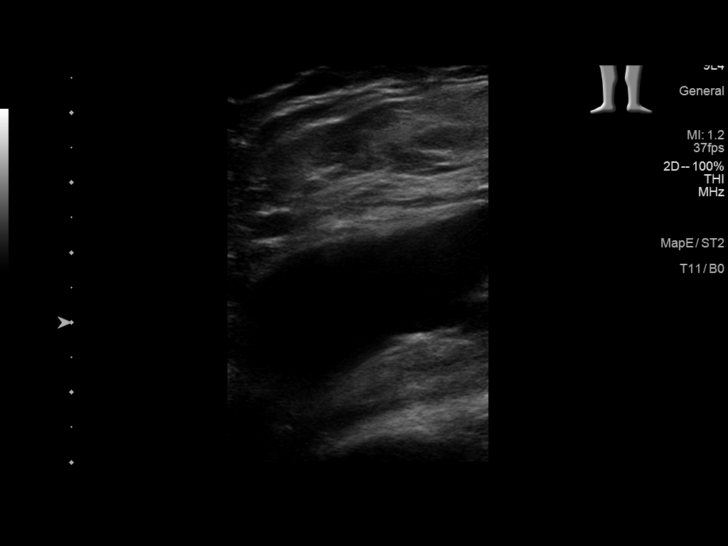
[im 9/34]
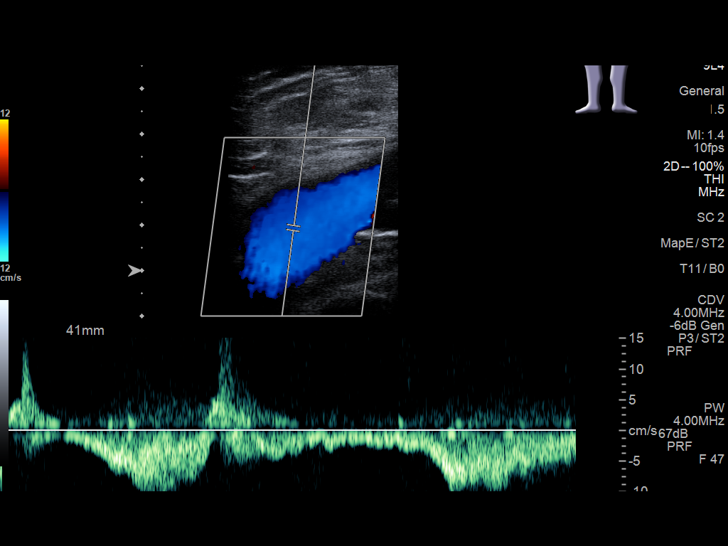
[im 11/34]
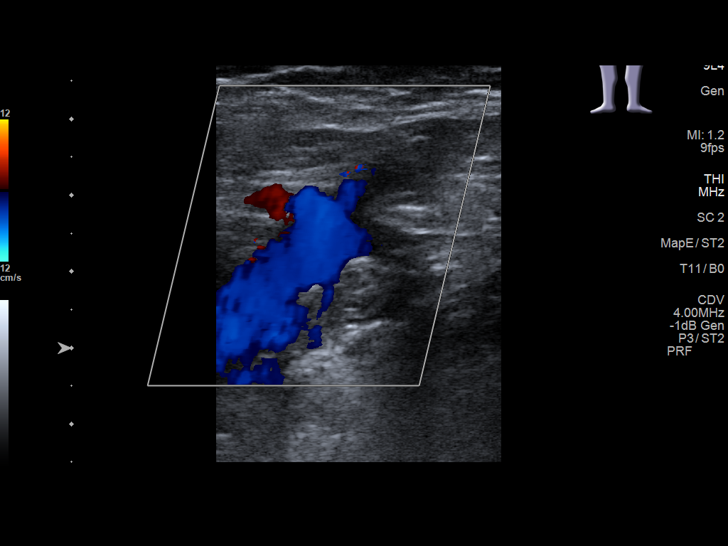
[im 13/34]
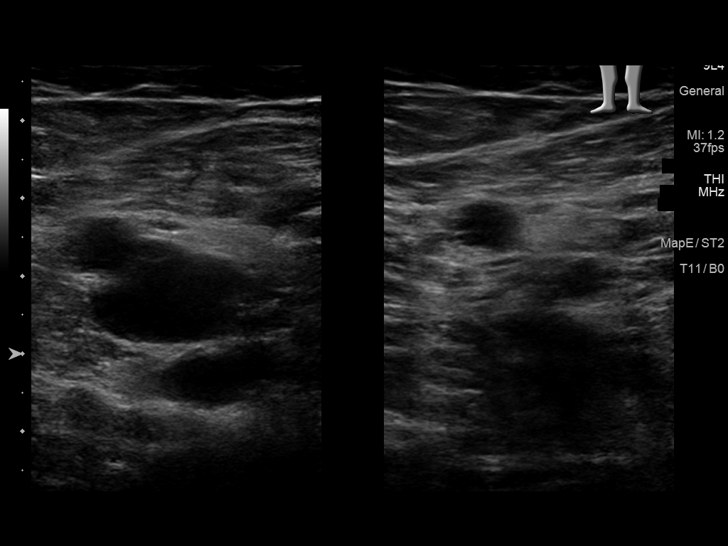
[im 16/34]
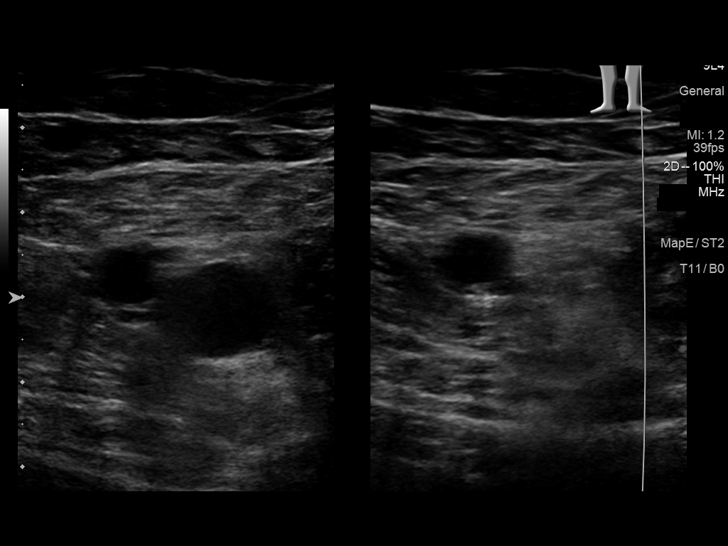
[im 18/34]
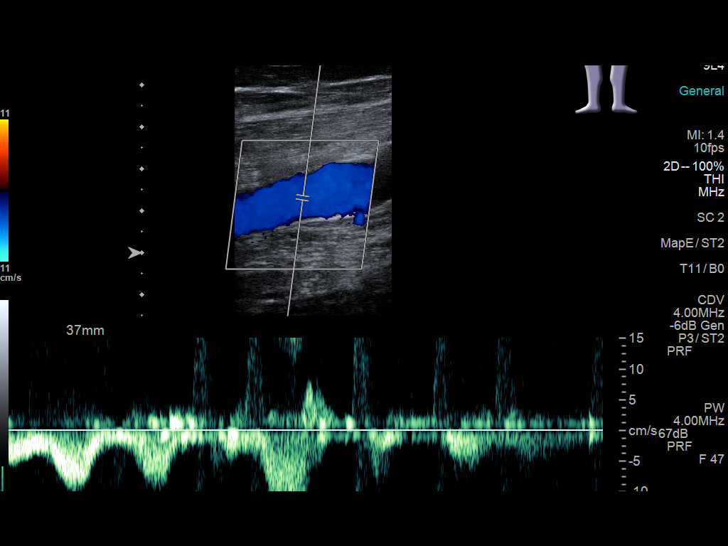
[im 21/34]
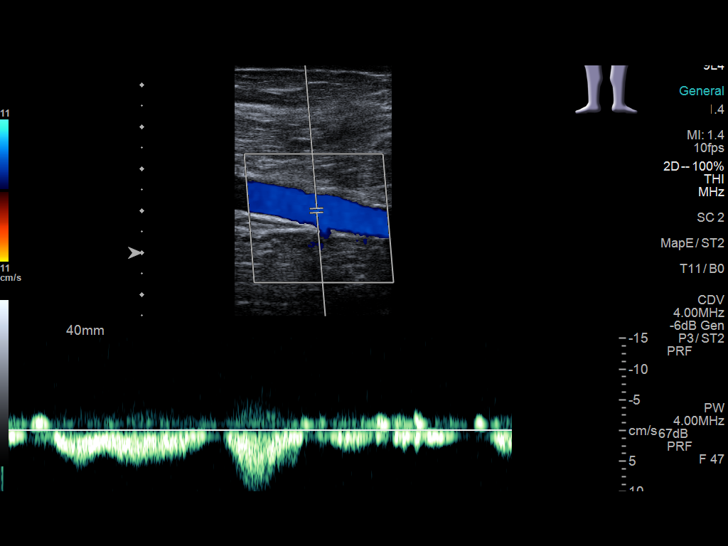
[im 23/34]
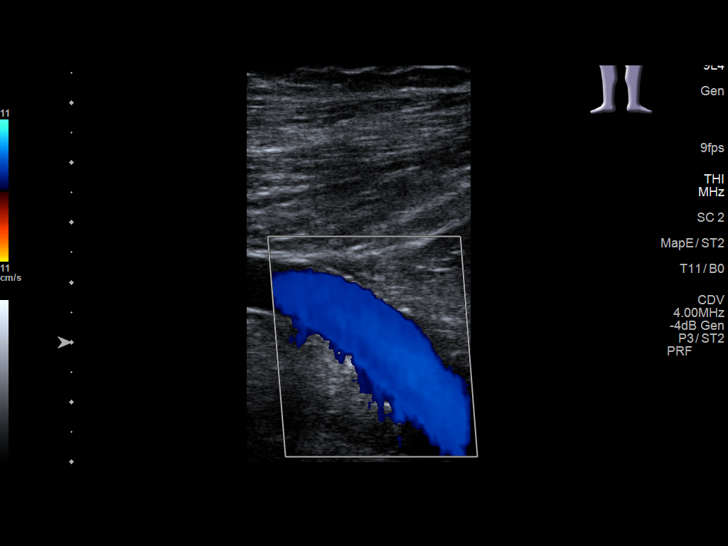
[im 26/34]
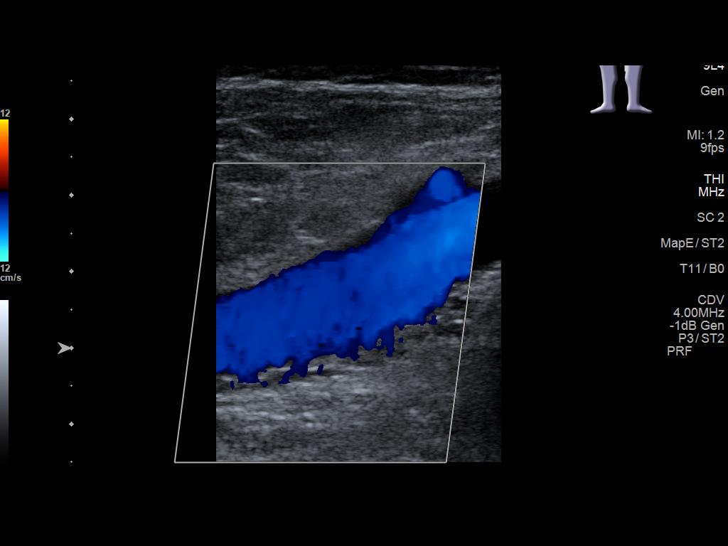
[im 28/34]
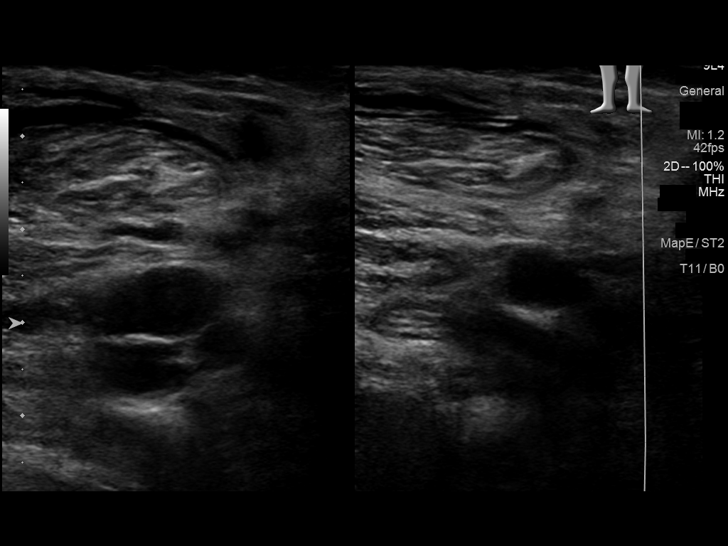
[im 31/34]
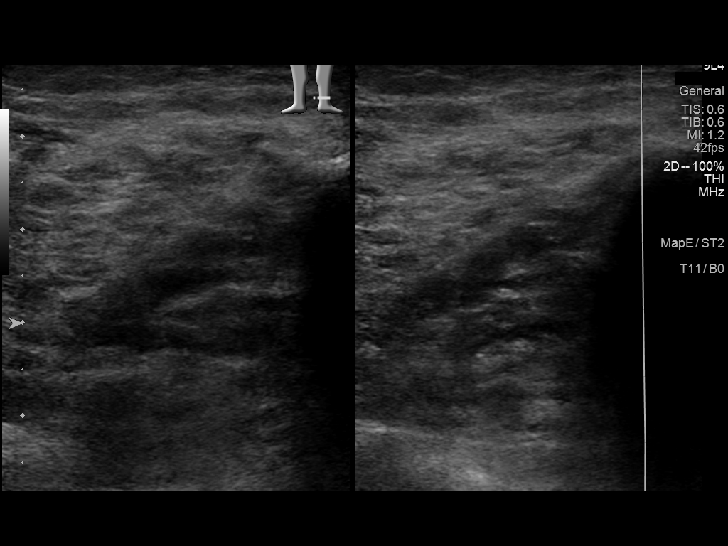
[im 34/34]
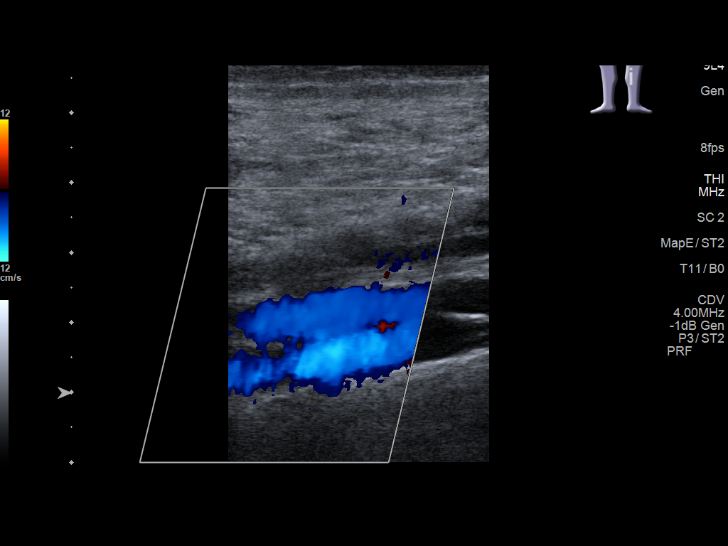

[14 of 24 positions shown; findings below may reference images not displayed]

FINDINGS: VENOUS

Normal compressibility of the common femoral, superficial femoral,
and popliteal veins, as well as the visualized calf veins.
Visualized portions of profunda femoral vein and great saphenous
vein unremarkable. No filling defects to suggest DVT on grayscale or
color Doppler imaging. Doppler waveforms show normal direction of
venous flow, normal respiratory phasicity and response to
augmentation.

Limited views of the contralateral common femoral vein are
unremarkable.

OTHER

None.
IMPRESSION: No femoropopliteal DVT nor evidence of DVT within the visualized
calf veins.

If clinical symptoms are inconsistent or if there are persistent or
worsening symptoms, further imaging (possibly involving the iliac
veins) may be warranted.

## 2020-06-30 DIAGNOSIS — I2510 Atherosclerotic heart disease of native coronary artery without angina pectoris: Secondary | ICD-10-CM | POA: Diagnosis not present

## 2020-06-30 DIAGNOSIS — Z79899 Other long term (current) drug therapy: Secondary | ICD-10-CM | POA: Diagnosis not present

## 2020-06-30 DIAGNOSIS — E782 Mixed hyperlipidemia: Secondary | ICD-10-CM | POA: Diagnosis not present

## 2020-06-30 DIAGNOSIS — J4520 Mild intermittent asthma, uncomplicated: Secondary | ICD-10-CM | POA: Diagnosis not present

## 2020-06-30 DIAGNOSIS — I480 Paroxysmal atrial fibrillation: Secondary | ICD-10-CM | POA: Diagnosis not present

## 2020-06-30 DIAGNOSIS — N400 Enlarged prostate without lower urinary tract symptoms: Secondary | ICD-10-CM | POA: Diagnosis not present

## 2020-06-30 DIAGNOSIS — R7302 Impaired glucose tolerance (oral): Secondary | ICD-10-CM | POA: Diagnosis not present

## 2020-06-30 DIAGNOSIS — E038 Other specified hypothyroidism: Secondary | ICD-10-CM | POA: Diagnosis not present

## 2020-06-30 DIAGNOSIS — Z125 Encounter for screening for malignant neoplasm of prostate: Secondary | ICD-10-CM | POA: Diagnosis not present

## 2020-06-30 DIAGNOSIS — I10 Essential (primary) hypertension: Secondary | ICD-10-CM | POA: Diagnosis not present

## 2020-09-19 DIAGNOSIS — K5792 Diverticulitis of intestine, part unspecified, without perforation or abscess without bleeding: Secondary | ICD-10-CM | POA: Diagnosis not present

## 2020-11-02 DIAGNOSIS — J330 Polyp of nasal cavity: Secondary | ICD-10-CM | POA: Diagnosis not present

## 2020-11-02 DIAGNOSIS — J3489 Other specified disorders of nose and nasal sinuses: Secondary | ICD-10-CM | POA: Diagnosis not present

## 2021-01-19 DIAGNOSIS — I480 Paroxysmal atrial fibrillation: Secondary | ICD-10-CM | POA: Diagnosis not present

## 2021-01-19 DIAGNOSIS — J4520 Mild intermittent asthma, uncomplicated: Secondary | ICD-10-CM | POA: Diagnosis not present

## 2021-01-19 DIAGNOSIS — I2510 Atherosclerotic heart disease of native coronary artery without angina pectoris: Secondary | ICD-10-CM | POA: Diagnosis not present

## 2021-01-19 DIAGNOSIS — I10 Essential (primary) hypertension: Secondary | ICD-10-CM | POA: Diagnosis not present

## 2021-01-19 DIAGNOSIS — E038 Other specified hypothyroidism: Secondary | ICD-10-CM | POA: Diagnosis not present

## 2021-01-19 DIAGNOSIS — R7302 Impaired glucose tolerance (oral): Secondary | ICD-10-CM | POA: Diagnosis not present

## 2021-01-19 DIAGNOSIS — Z0000 Encounter for general adult medical examination without abnormal findings: Secondary | ICD-10-CM | POA: Diagnosis not present

## 2021-01-19 DIAGNOSIS — E782 Mixed hyperlipidemia: Secondary | ICD-10-CM | POA: Diagnosis not present

## 2021-01-19 DIAGNOSIS — N400 Enlarged prostate without lower urinary tract symptoms: Secondary | ICD-10-CM | POA: Diagnosis not present

## 2021-01-19 DIAGNOSIS — Z79899 Other long term (current) drug therapy: Secondary | ICD-10-CM | POA: Diagnosis not present

## 2021-03-28 DIAGNOSIS — E7800 Pure hypercholesterolemia, unspecified: Secondary | ICD-10-CM | POA: Diagnosis not present

## 2021-03-28 DIAGNOSIS — I10 Essential (primary) hypertension: Secondary | ICD-10-CM | POA: Diagnosis not present

## 2021-03-28 DIAGNOSIS — R9431 Abnormal electrocardiogram [ECG] [EKG]: Secondary | ICD-10-CM | POA: Diagnosis not present

## 2021-03-28 DIAGNOSIS — I480 Paroxysmal atrial fibrillation: Secondary | ICD-10-CM | POA: Diagnosis not present

## 2021-03-28 DIAGNOSIS — I2510 Atherosclerotic heart disease of native coronary artery without angina pectoris: Secondary | ICD-10-CM | POA: Diagnosis not present

## 2021-05-07 ENCOUNTER — Other Ambulatory Visit: Payer: Self-pay

## 2021-05-07 ENCOUNTER — Ambulatory Visit
Admission: EM | Admit: 2021-05-07 | Discharge: 2021-05-07 | Disposition: A | Discharge status: Home / Self Care | Payer: PPO | Attending: Emergency Medicine | Admitting: Emergency Medicine

## 2021-05-07 DIAGNOSIS — J069 Acute upper respiratory infection, unspecified: Secondary | ICD-10-CM | POA: Diagnosis not present

## 2021-05-07 MED ORDER — BENZONATATE 100 MG PO CAPS: 100.0000 mg | ORAL_CAPSULE | Freq: Three times a day (TID) | ORAL | 0 refills | Status: DC

## 2021-05-07 MED ORDER — PROMETHAZINE-DM 6.25-15 MG/5ML PO SYRP: 2.5000 mL | ORAL_SOLUTION | Freq: Two times a day (BID) | ORAL | 0 refills | Status: DC | PRN

## 2021-05-07 NOTE — ED Triage Notes (Signed)
Pt c/o sore throat, cough, sxs started this morning.

## 2021-05-07 NOTE — Discharge Instructions (Signed)
Your symptoms today are most likely being caused by a virus and should steadily improve in time it can take up to 7 to 10 days before you truly start to see a turnaround however things will get better  May use Tessalon pill every 8 hours to help calm coughing  You may use cough syrup twice a day, be mindful of this medication make you drowsy, if this occurs you may take at bedtime  Yo may attempt any of the following below as symptoms arise    You can take Tylenol and/or Ibuprofen as needed for fever reduction and pain relief.   For cough: honey 1/2 to 1 teaspoon (you can dilute the honey in water or another fluid).  You can also use guaifenesin and dextromethorphan for cough. You can use a humidifier for chest congestion and cough.  If you don't have a humidifier, you can sit in the bathroom with the hot shower running.      For sore throat: try warm salt water gargles, cepacol lozenges, throat spray, warm tea or water with lemon/honey, popsicles or ice, or OTC cold relief medicine for throat discomfort.   For congestion: take a daily anti-histamine like Zyrtec, Claritin, and a oral decongestant, such as pseudoephedrine.  You can also use Flonase 1-2 sprays in each nostril daily.   It is important to stay hydrated: drink plenty of fluids (water, gatorade/powerade/pedialyte, juices, or teas) to keep your throat moisturized and help further relieve irritation/discomfort.

## 2021-05-07 NOTE — ED Provider Notes (Signed)
[facility]    CSN: [id number] Arrival date & time: 05/07/21  1420      History   Chief Complaint Chief Complaint  Patient presents with   Sore Throat   Cough    HPI Shawn Lowery is a 76 y.o. male.   Patient presents with sore throat and nonproductive cough beginning today. Attempted use of chloraseptic spray, which was helpful.  Known sick contacts.  Learning food and liquids.  History of hyperlipidemia, hypertension, arthritis, asthma, CAD, diverticulitis.  Denies fever, chills, body aches, abdominal pain, nausea, vomiting, diarrhea, chest pain or tightness, shortness of breath, wheezing, headaches, ear pain.  Past Medical History:  Diagnosis Date   Arthritis    Asthma    CAD (coronary artery disease)    Diverticulitis    Diverticulosis    Hyperlipemia    Hypertension    IGT (impaired glucose tolerance)    Rectal bleeding     Patient Active Problem List   Diagnosis Date Noted   Airway hyperreactivity 01/06/2015   Hypercholesteremia 01/06/2015   CAD (coronary artery disease) 12/31/2014   IGT (impaired glucose tolerance) 12/31/2014   Lower GI bleed 12/31/2014   Hypotension 12/27/2014   Essential hypertension 12/27/2014   BPH (benign prostatic hypertrophy) with urinary retention 12/27/2014   Diverticular hemorrhage 12/27/2014   Hypomagnesemia 12/25/2014   Chronic sinusitis 12/25/2014   Rectal bleed 12/24/2014   Acute lower GI bleeding    Poor venous access    GI bleed 12/23/2014    Past Surgical History:  Procedure Laterality Date   COLONOSCOPY WITH PROPOFOL N/A 02/10/2015   Procedure: COLONOSCOPY WITH PROPOFOL;  Surgeon: Lollie Sails, MD;  Location: [facility] ENDOSCOPY;  Service: Endoscopy;  Laterality: N/A;   COLONOSCOPY WITH PROPOFOL N/A 02/13/2015   Procedure: COLONOSCOPY WITH PROPOFOL;  Surgeon: Lollie Sails, MD;  Location: [facility] ENDOSCOPY;  Service: Endoscopy;  Laterality: N/A;   HERNIA REPAIR         Home Medications    Prior to  Admission medications   Medication Sig Start Date End Date Taking? Authorizing Provider  amLODipine (NORVASC) 5 MG tablet Take 5 mg by mouth daily.   Yes [provider]  finasteride (PROSCAR) 5 MG tablet Take 5 mg by mouth daily.   Yes [provider]  lisinopril-hydrochlorothiazide (PRINZIDE,ZESTORETIC) 20-25 MG per tablet Take 1 tablet by mouth daily.   Yes [provider]  lovastatin (MEVACOR) 20 MG tablet Take 20 mg by mouth daily.    Yes [provider]  Multiple Vitamin (MULTIVITAMIN WITH MINERALS) TABS tablet Take 1 tablet by mouth every Monday, Wednesday, and Friday.   Yes [provider]  tamsulosin (FLOMAX) 0.4 MG CAPS capsule TAKE ONE CAPSULE BY MOUTH ONCE DAILY AFTER SUPPER 02/21/15  Yes Roda Shutters, FNP  Fluticasone-Salmeterol (ADVAIR) 100-50 MCG/DOSE AEPB Inhale 1 puff into the lungs 2 (two) times daily as needed (for wheezing or shortness of breath).     [provider]    Family History Family History  Problem Relation Age of Onset   Hypertension Father    CAD Father    Stroke Mother     Social History Social History   Tobacco Use   Smoking status: Former    Packs/day: 1.00    Years: 15.00    Pack years: 15.00    Types: Cigarettes   Smokeless tobacco: Former    Quit date: 12/31/1983  Substance Use Topics   Alcohol use: No   Drug use: No  Allergies   Patient has no known allergies.   Review of Systems Review of Systems  Constitutional: Negative.   HENT:  Positive for congestion, rhinorrhea and sore throat. Negative for dental problem, drooling, ear discharge, ear pain, facial swelling, hearing loss, mouth sores, nosebleeds, postnasal drip, sinus pressure, sinus pain, sneezing, tinnitus, trouble swallowing and voice change.   Respiratory:  Positive for cough. Negative for apnea, choking, chest tightness, shortness of breath, wheezing and stridor.   Cardiovascular: Negative.   Gastrointestinal:  Negative.   Neurological: Negative.     Physical Exam Triage Vital Signs ED Triage Vitals  Enc Vitals Group     BP 05/07/21 1511 137/73     Pulse Rate 05/07/21 1511 69     Resp 05/07/21 1511 16     Temp 05/07/21 1511 97.8 F (36.6 C)     Temp Source 05/07/21 1511 Oral     SpO2 05/07/21 1511 96 %     Weight 05/07/21 1510 192 lb 0.3 oz (87.1 kg)     Height 05/07/21 1510 5\' 11"  (1.803 m)     Head Circumference --      Peak Flow --      Pain Score 05/07/21 1510 6     Pain Loc --      Pain Edu? --      Excl. in [place]? --    No data found.  Updated Vital Signs BP 137/73 (BP Location: Left Arm)    Pulse 69    Temp 97.8 F (36.6 C) (Oral)    Resp 16    Ht 5\' 11"  (1.803 m)    Wt 192 lb 0.3 oz (87.1 kg)    SpO2 96%    BMI 26.78 kg/m   Visual Acuity Right Eye Distance:   Left Eye Distance:   Bilateral Distance:    Right Eye Near:   Left Eye Near:    Bilateral Near:     Physical Exam Constitutional:      Appearance: Normal appearance.  HENT:     Head: Normocephalic.     Right Ear: Tympanic membrane, ear canal and external ear normal.     Left Ear: Tympanic membrane, ear canal and external ear normal.     Mouth/Throat:     Mouth: Mucous membranes are moist.     Pharynx: Posterior oropharyngeal erythema present.  Eyes:     Extraocular Movements: Extraocular movements intact.  Cardiovascular:     Rate and Rhythm: Normal rate and regular rhythm.     Pulses: Normal pulses.     Heart sounds: Normal heart sounds.  Pulmonary:     Effort: Pulmonary effort is normal.     Breath sounds: Normal breath sounds.  Skin:    General: Skin is warm and dry.  Neurological:     Mental Status: He is alert and oriented to person, place, and time. Mental status is at baseline.  Psychiatric:        Mood and Affect: Mood normal.        Behavior: Behavior normal.     UC Treatments / Results  Labs (all labs ordered are listed, but only abnormal results are displayed) Labs Reviewed - No  data to display  EKG   Radiology No results found.  Procedures Procedures (including critical care time)  Medications Ordered in UC Medications - No data to display  Initial Impression / Assessment and Plan / UC Course  I have reviewed the triage vital signs and the nursing notes.  Pertinent labs & imaging results that were available during my care of the patient were reviewed by me and considered in my medical decision making (see chart for details).  URI with cough  Discussed etiology of symptoms, timeline and possible resolution with patient, declined COVID, flu testing, endorses "I am just wanting to get ahead of my cough", prescribed Tessalon 100 mg 3 times daily as needed and Promethazine DM 6.25-15 mg / 5 mL 2.5 mL twice daily as needed, recommended honey, humidifier, steam showers, increased hydration in addition to help manage coughing, may follow-up with urgent care PCP as needed Final Clinical Impressions(s) / UC Diagnoses   Final diagnoses:  None   Discharge Instructions   None    ED Prescriptions   None    PDMP not reviewed this encounter.   Hans Eden, NP 05/07/21 1625

## 2021-06-05 DIAGNOSIS — H353121 Nonexudative age-related macular degeneration, left eye, early dry stage: Secondary | ICD-10-CM | POA: Diagnosis not present

## 2021-06-05 DIAGNOSIS — H2513 Age-related nuclear cataract, bilateral: Secondary | ICD-10-CM | POA: Diagnosis not present

## 2021-07-19 DIAGNOSIS — N400 Enlarged prostate without lower urinary tract symptoms: Secondary | ICD-10-CM | POA: Diagnosis not present

## 2021-07-19 DIAGNOSIS — I10 Essential (primary) hypertension: Secondary | ICD-10-CM | POA: Diagnosis not present

## 2021-07-19 DIAGNOSIS — I480 Paroxysmal atrial fibrillation: Secondary | ICD-10-CM | POA: Diagnosis not present

## 2021-07-19 DIAGNOSIS — E038 Other specified hypothyroidism: Secondary | ICD-10-CM | POA: Diagnosis not present

## 2021-07-19 DIAGNOSIS — L989 Disorder of the skin and subcutaneous tissue, unspecified: Secondary | ICD-10-CM | POA: Diagnosis not present

## 2021-07-19 DIAGNOSIS — R7302 Impaired glucose tolerance (oral): Secondary | ICD-10-CM | POA: Diagnosis not present

## 2021-07-19 DIAGNOSIS — E782 Mixed hyperlipidemia: Secondary | ICD-10-CM | POA: Diagnosis not present

## 2021-07-19 DIAGNOSIS — J4520 Mild intermittent asthma, uncomplicated: Secondary | ICD-10-CM | POA: Diagnosis not present

## 2021-07-19 DIAGNOSIS — Z79899 Other long term (current) drug therapy: Secondary | ICD-10-CM | POA: Diagnosis not present

## 2021-07-19 DIAGNOSIS — I2510 Atherosclerotic heart disease of native coronary artery without angina pectoris: Secondary | ICD-10-CM | POA: Diagnosis not present

## 2021-08-06 DIAGNOSIS — C44519 Basal cell carcinoma of skin of other part of trunk: Secondary | ICD-10-CM | POA: Diagnosis not present

## 2021-08-06 DIAGNOSIS — D485 Neoplasm of uncertain behavior of skin: Secondary | ICD-10-CM | POA: Diagnosis not present

## 2021-10-03 DIAGNOSIS — C4491 Basal cell carcinoma of skin, unspecified: Secondary | ICD-10-CM | POA: Diagnosis not present

## 2021-10-03 DIAGNOSIS — C44519 Basal cell carcinoma of skin of other part of trunk: Secondary | ICD-10-CM | POA: Diagnosis not present

## 2021-11-08 DIAGNOSIS — J3489 Other specified disorders of nose and nasal sinuses: Secondary | ICD-10-CM | POA: Diagnosis not present

## 2021-11-08 DIAGNOSIS — J342 Deviated nasal septum: Secondary | ICD-10-CM | POA: Diagnosis not present

## 2021-11-08 DIAGNOSIS — J330 Polyp of nasal cavity: Secondary | ICD-10-CM | POA: Diagnosis not present

## 2022-02-21 DIAGNOSIS — I10 Essential (primary) hypertension: Secondary | ICD-10-CM | POA: Diagnosis not present

## 2022-02-21 DIAGNOSIS — Z79899 Other long term (current) drug therapy: Secondary | ICD-10-CM | POA: Diagnosis not present

## 2022-02-21 DIAGNOSIS — R7302 Impaired glucose tolerance (oral): Secondary | ICD-10-CM | POA: Diagnosis not present

## 2022-02-21 DIAGNOSIS — Z0000 Encounter for general adult medical examination without abnormal findings: Secondary | ICD-10-CM | POA: Diagnosis not present

## 2022-02-21 DIAGNOSIS — N400 Enlarged prostate without lower urinary tract symptoms: Secondary | ICD-10-CM | POA: Diagnosis not present

## 2022-02-21 DIAGNOSIS — I480 Paroxysmal atrial fibrillation: Secondary | ICD-10-CM | POA: Diagnosis not present

## 2022-02-21 DIAGNOSIS — E782 Mixed hyperlipidemia: Secondary | ICD-10-CM | POA: Diagnosis not present

## 2022-02-21 DIAGNOSIS — I2510 Atherosclerotic heart disease of native coronary artery without angina pectoris: Secondary | ICD-10-CM | POA: Diagnosis not present

## 2022-02-21 DIAGNOSIS — E038 Other specified hypothyroidism: Secondary | ICD-10-CM | POA: Diagnosis not present

## 2022-02-21 DIAGNOSIS — J4520 Mild intermittent asthma, uncomplicated: Secondary | ICD-10-CM | POA: Diagnosis not present

## 2022-03-20 DIAGNOSIS — I10 Essential (primary) hypertension: Secondary | ICD-10-CM | POA: Diagnosis not present

## 2022-03-20 DIAGNOSIS — I480 Paroxysmal atrial fibrillation: Secondary | ICD-10-CM | POA: Diagnosis not present

## 2022-03-20 DIAGNOSIS — I2510 Atherosclerotic heart disease of native coronary artery without angina pectoris: Secondary | ICD-10-CM | POA: Diagnosis not present

## 2022-03-20 DIAGNOSIS — E7800 Pure hypercholesterolemia, unspecified: Secondary | ICD-10-CM | POA: Diagnosis not present

## 2022-03-29 DIAGNOSIS — J101 Influenza due to other identified influenza virus with other respiratory manifestations: Secondary | ICD-10-CM | POA: Diagnosis not present

## 2022-04-09 DIAGNOSIS — L570 Actinic keratosis: Secondary | ICD-10-CM | POA: Diagnosis not present

## 2022-04-09 DIAGNOSIS — L578 Other skin changes due to chronic exposure to nonionizing radiation: Secondary | ICD-10-CM | POA: Diagnosis not present

## 2022-04-09 DIAGNOSIS — C44612 Basal cell carcinoma of skin of right upper limb, including shoulder: Secondary | ICD-10-CM | POA: Diagnosis not present

## 2022-04-09 DIAGNOSIS — D225 Melanocytic nevi of trunk: Secondary | ICD-10-CM | POA: Diagnosis not present

## 2022-04-09 DIAGNOSIS — D485 Neoplasm of uncertain behavior of skin: Secondary | ICD-10-CM | POA: Diagnosis not present

## 2022-04-09 DIAGNOSIS — Z85828 Personal history of other malignant neoplasm of skin: Secondary | ICD-10-CM | POA: Diagnosis not present

## 2022-04-15 DIAGNOSIS — R0600 Dyspnea, unspecified: Secondary | ICD-10-CM | POA: Diagnosis not present

## 2022-04-15 DIAGNOSIS — G9331 Postviral fatigue syndrome: Secondary | ICD-10-CM | POA: Diagnosis not present

## 2022-04-15 DIAGNOSIS — R059 Cough, unspecified: Secondary | ICD-10-CM | POA: Diagnosis not present

## 2022-05-10 DIAGNOSIS — L988 Other specified disorders of the skin and subcutaneous tissue: Secondary | ICD-10-CM | POA: Diagnosis not present

## 2022-05-10 DIAGNOSIS — C4491 Basal cell carcinoma of skin, unspecified: Secondary | ICD-10-CM | POA: Diagnosis not present

## 2022-09-02 DIAGNOSIS — I10 Essential (primary) hypertension: Secondary | ICD-10-CM | POA: Diagnosis not present

## 2022-09-02 DIAGNOSIS — E038 Other specified hypothyroidism: Secondary | ICD-10-CM | POA: Diagnosis not present

## 2022-09-02 DIAGNOSIS — N400 Enlarged prostate without lower urinary tract symptoms: Secondary | ICD-10-CM | POA: Diagnosis not present

## 2022-09-02 DIAGNOSIS — I2510 Atherosclerotic heart disease of native coronary artery without angina pectoris: Secondary | ICD-10-CM | POA: Diagnosis not present

## 2022-09-02 DIAGNOSIS — R7302 Impaired glucose tolerance (oral): Secondary | ICD-10-CM | POA: Diagnosis not present

## 2022-09-02 DIAGNOSIS — J4520 Mild intermittent asthma, uncomplicated: Secondary | ICD-10-CM | POA: Diagnosis not present

## 2022-09-02 DIAGNOSIS — Z79899 Other long term (current) drug therapy: Secondary | ICD-10-CM | POA: Diagnosis not present

## 2022-09-02 DIAGNOSIS — I480 Paroxysmal atrial fibrillation: Secondary | ICD-10-CM | POA: Diagnosis not present

## 2022-09-02 DIAGNOSIS — E782 Mixed hyperlipidemia: Secondary | ICD-10-CM | POA: Diagnosis not present

## 2022-09-02 DIAGNOSIS — Z125 Encounter for screening for malignant neoplasm of prostate: Secondary | ICD-10-CM | POA: Diagnosis not present

## 2022-10-23 DIAGNOSIS — H2513 Age-related nuclear cataract, bilateral: Secondary | ICD-10-CM | POA: Diagnosis not present

## 2022-10-23 DIAGNOSIS — H5203 Hypermetropia, bilateral: Secondary | ICD-10-CM | POA: Diagnosis not present

## 2022-10-23 DIAGNOSIS — H353121 Nonexudative age-related macular degeneration, left eye, early dry stage: Secondary | ICD-10-CM | POA: Diagnosis not present

## 2022-10-23 DIAGNOSIS — H52223 Regular astigmatism, bilateral: Secondary | ICD-10-CM | POA: Diagnosis not present

## 2022-10-25 ENCOUNTER — Ambulatory Visit
Admission: EM | Admit: 2022-10-25 | Discharge: 2022-10-25 | Disposition: A | Discharge status: Home / Self Care | Payer: PPO | Attending: Family Medicine | Admitting: Family Medicine

## 2022-10-25 ENCOUNTER — Ambulatory Visit (INDEPENDENT_AMBULATORY_CARE_PROVIDER_SITE_OTHER): Payer: PPO

## 2022-10-25 DIAGNOSIS — M19071 Primary osteoarthritis, right ankle and foot: Secondary | ICD-10-CM | POA: Diagnosis not present

## 2022-10-25 DIAGNOSIS — M722 Plantar fascial fibromatosis: Secondary | ICD-10-CM

## 2022-10-25 DIAGNOSIS — M7731 Calcaneal spur, right foot: Secondary | ICD-10-CM | POA: Diagnosis not present

## 2022-10-25 DIAGNOSIS — M79671 Pain in right foot: Secondary | ICD-10-CM | POA: Diagnosis not present

## 2022-10-25 MED ORDER — NAPROXEN 500 MG PO TABS: 500.0000 mg | ORAL_TABLET | Freq: Two times a day (BID) | ORAL | 0 refills | Status: AC

## 2022-10-25 MED ORDER — IBUPROFEN 800 MG PO TABS
800.0000 mg | ORAL_TABLET | Freq: Once | ORAL | Status: AC
  Administered 2022-10-25: 800 mg via ORAL

## 2022-10-25 MED ORDER — IBUPROFEN 600 MG PO TABS: 600.0000 mg | ORAL_TABLET | Freq: Once | ORAL | Status: DC

## 2022-10-25 NOTE — Discharge Instructions (Addendum)
See handout on plantar fascitis. If medication was prescribed, stop by the pharmacy to pick up your prescriptions.  For your  pain, Take 1500 mg Tylenol twice a day, take Naprosyn twice a day,  as needed for pain. Roll your foot with a frozen bottle of water while seated for about 20 minutes.  As pain recedes, begin normal activities slowly as tolerated.  Follow up with a foot specialist, if symptoms persist. Call Dr Ether Griffins for follow up.   Watch for worsening symptoms such as an increasing weakness or loss of sensation, increasing pain and/or the loss of bladder or bowel function. Should any of these occur, go to the emergency department immediately.

## 2022-10-25 NOTE — ED Triage Notes (Signed)
Pt c/o right foot pain x3weeks  Pt states that it was swollen a week ago while he was camping  Pt is worried about gout or arthritis.  Pt states that the pain is along the instep but has spread to his first 2 toes.   Pt states that he occasionally gets the pain in his knee.   Pt has used Valsartan gel and 3 500mg  tylenol when he feels pain.  Pt took 1000mg  tylneol at 6:30am and used the valsartan gel

## 2022-10-25 NOTE — ED Provider Notes (Signed)
[facility]    CSN: [id number] Arrival date & time: 10/25/22  4098      History   Chief Complaint No chief complaint on file.   HPI  HPI Shawn Lowery is a 77 y.o. male.   Shawn Lowery presents for right foot pain for the past 3 weeks. Denis previous injury or trauma.  Took some Tylenol around 6 AM this morning with some relief.  Pain worse in the morning.  He has pain on the bottom of his heel.  Pain gets worse when he goes fishing.  No recent falls or trips.  Does not hear any abnormal pops or sounds with walking.  Denies any redness or swelling.  He is concerned that he may have gout.  History of gout. He has otherwise been well has no additional concerns today.   Past Medical History:  Diagnosis Date   Arthritis    Asthma    CAD (coronary artery disease)    Diverticulitis    Diverticulosis    Hyperlipemia    Hypertension    IGT (impaired glucose tolerance)    Rectal bleeding     Patient Active Problem List   Diagnosis Date Noted   Airway hyperreactivity 01/06/2015   Hypercholesteremia 01/06/2015   CAD (coronary artery disease) 12/31/2014   IGT (impaired glucose tolerance) 12/31/2014   Lower GI bleed 12/31/2014   Hypotension 12/27/2014   Essential hypertension 12/27/2014   BPH (benign prostatic hypertrophy) with urinary retention 12/27/2014   Diverticular hemorrhage 12/27/2014   Hypomagnesemia 12/25/2014   Chronic sinusitis 12/25/2014   Rectal bleed 12/24/2014   Acute lower GI bleeding    Poor venous access    GI bleed 12/23/2014    Past Surgical History:  Procedure Laterality Date   COLONOSCOPY WITH PROPOFOL N/A 02/10/2015   Procedure: COLONOSCOPY WITH PROPOFOL;  Surgeon: Christena Deem, MD;  Location: [facility] ENDOSCOPY;  Service: Endoscopy;  Laterality: N/A;   COLONOSCOPY WITH PROPOFOL N/A 02/13/2015   Procedure: COLONOSCOPY WITH PROPOFOL;  Surgeon: Christena Deem, MD;  Location: [facility] ENDOSCOPY;  Service: Endoscopy;  Laterality: N/A;   HERNIA  REPAIR         Home Medications    Prior to Admission medications   Medication Sig Start Date End Date Taking? Authorizing Provider  amLODipine (NORVASC) 5 MG tablet Take 5 mg by mouth daily.    [provider]  benzonatate (TESSALON) 100 MG capsule Take 1 capsule (100 mg total) by mouth every 8 (eight) hours. 05/07/21   White, Elita Boone, NP  finasteride (PROSCAR) 5 MG tablet Take 5 mg by mouth daily.    [provider]  Fluticasone-Salmeterol (ADVAIR) 100-50 MCG/DOSE AEPB Inhale 1 puff into the lungs 2 (two) times daily as needed (for wheezing or shortness of breath).     [provider]  lisinopril-hydrochlorothiazide (PRINZIDE,ZESTORETIC) 20-25 MG per tablet Take 1 tablet by mouth daily.    [provider]  lovastatin (MEVACOR) 20 MG tablet Take 20 mg by mouth daily.     [provider]  Multiple Vitamin (MULTIVITAMIN WITH MINERALS) TABS tablet Take 1 tablet by mouth every Monday, Wednesday, and Friday.    [provider]  promethazine-dextromethorphan (PROMETHAZINE-DM) 6.25-15 MG/5ML syrup Take 2.5 mLs by mouth 2 (two) times daily as needed for cough. 05/07/21   Valinda Hoar, NP  tamsulosin (FLOMAX) 0.4 MG CAPS capsule TAKE ONE CAPSULE BY MOUTH ONCE DAILY AFTER SUPPER 02/21/15   Fernanda Drum, FNP    Family History  Family History  Problem Relation Age of Onset   Hypertension Father    CAD Father    Stroke Mother     Social History Social History   Tobacco Use   Smoking status: Former    Packs/day: 1.00    Years: 15.00    Additional pack years: 0.00    Total pack years: 15.00    Types: Cigarettes   Smokeless tobacco: Former    Quit date: 12/31/1983  Substance Use Topics   Alcohol use: No   Drug use: No     Allergies   Patient has no known allergies.   Review of Systems Review of Systems: :negative unless otherwise stated in HPI.      Physical Exam Triage Vital Signs ED Triage Vitals  Enc Vitals  Group     BP      Pulse      Resp      Temp      Temp src      SpO2      Weight      Height      Head Circumference      Peak Flow      Pain Score      Pain Loc      Pain Edu?      Excl. in GC?    No data found.  Updated Vital Signs There were no vitals taken for this visit.  Visual Acuity Right Eye Distance:   Left Eye Distance:   Bilateral Distance:    Right Eye Near:   Left Eye Near:    Bilateral Near:     Physical Exam GEN: well appearing male in no acute distress  CVS: well perfused, distal pulses intact  RESP: speaking in full sentences without pause, no respiratory distress  MSK: Ankle/Foot, right: TTP noted at the ***. No visible erythema, swelling, ecchymosis, or bony deformity. No*** notable pes planus deformity. Transverse arch grossly ***intact;  No*** evidence of tibiotalar ***deviation; Range of motion is full*** in all directions. Strength is 5/5*** in all directions. No*** tenderness at the insertion/body/myotendinous junction of the Achilles tendon; No*** tenderness on posterior aspects of lateral and medial malleolus; Unremarkable*** squeeze; Talar dome non***tender; Unremarkable*** calcaneal squeeze; No*** plantar calcaneal tenderness; No*** tenderness over the navicular prominence or  over cuboid; No*** pain at base of 5th MT; No*** tenderness at the distal metatarsals; Un***Able to walk 4 steps.     UC Treatments / Results  Labs (all labs ordered are listed, but only abnormal results are displayed) Labs Reviewed - No data to display  EKG   Radiology No results found.   Procedures Procedures (including critical care time)  Medications Ordered in UC Medications - No data to display  Initial Impression / Assessment and Plan / UC Course  I have reviewed the triage vital signs and the nursing notes.  Pertinent labs & imaging results that were available during my care of the patient were reviewed by me and considered in my medical decision  making (see chart for details).      Pt is a 77 y.o.  male with 3 weeks of  right foot pain pain after ***.   On exam, pt has tenderness at *** concerning for ***.   Obtained *** right foot pain plain films.  Personally interpreted by me were ***unremarkable for fracture or dislocation. Radiologist report reviewed and additionally notes *** no soft tissue swelling.  Given ***Toradol IM/sling/brace/crutches  Patient to gradually return to normal  activities, as tolerated and continue ordinary activities within the limits permitted by pain. Prescribed Naproxen sodium *** and muscle relaxer *** for pain relief.  Tylenol PRN. Advised patient to avoid OTC NSAIDs while taking prescription NSAID. Counseled patient on red flag symptoms and when to seek immediate care.  ***No red flags such as progressive major motor weakness.   Patient to follow up with orthopedic provider, if symptoms do not improve with conservative treatment.  Return and ED precautions given. Understanding voiced. Discussed MDM, treatment plan and plan for follow-up with patient/parent who agrees with plan.   Final Clinical Impressions(s) / UC Diagnoses   Final diagnoses:  None   Discharge Instructions   None    ED Prescriptions   None    PDMP not reviewed this encounter.

## 2022-11-14 DIAGNOSIS — D492 Neoplasm of unspecified behavior of bone, soft tissue, and skin: Secondary | ICD-10-CM | POA: Diagnosis not present

## 2022-11-14 DIAGNOSIS — Z872 Personal history of diseases of the skin and subcutaneous tissue: Secondary | ICD-10-CM | POA: Diagnosis not present

## 2022-11-14 DIAGNOSIS — Z86018 Personal history of other benign neoplasm: Secondary | ICD-10-CM | POA: Diagnosis not present

## 2022-11-14 DIAGNOSIS — L570 Actinic keratosis: Secondary | ICD-10-CM | POA: Diagnosis not present

## 2022-11-14 DIAGNOSIS — Z85828 Personal history of other malignant neoplasm of skin: Secondary | ICD-10-CM | POA: Diagnosis not present

## 2022-11-14 DIAGNOSIS — L578 Other skin changes due to chronic exposure to nonionizing radiation: Secondary | ICD-10-CM | POA: Diagnosis not present

## 2023-03-07 DIAGNOSIS — N400 Enlarged prostate without lower urinary tract symptoms: Secondary | ICD-10-CM | POA: Diagnosis not present

## 2023-03-07 DIAGNOSIS — Z1331 Encounter for screening for depression: Secondary | ICD-10-CM | POA: Diagnosis not present

## 2023-03-07 DIAGNOSIS — E782 Mixed hyperlipidemia: Secondary | ICD-10-CM | POA: Diagnosis not present

## 2023-03-07 DIAGNOSIS — J4520 Mild intermittent asthma, uncomplicated: Secondary | ICD-10-CM | POA: Diagnosis not present

## 2023-03-07 DIAGNOSIS — R0982 Postnasal drip: Secondary | ICD-10-CM | POA: Diagnosis not present

## 2023-03-07 DIAGNOSIS — Z0000 Encounter for general adult medical examination without abnormal findings: Secondary | ICD-10-CM | POA: Diagnosis not present

## 2023-03-07 DIAGNOSIS — Z79899 Other long term (current) drug therapy: Secondary | ICD-10-CM | POA: Diagnosis not present

## 2023-03-07 DIAGNOSIS — I480 Paroxysmal atrial fibrillation: Secondary | ICD-10-CM | POA: Diagnosis not present

## 2023-03-07 DIAGNOSIS — I10 Essential (primary) hypertension: Secondary | ICD-10-CM | POA: Diagnosis not present

## 2023-03-07 DIAGNOSIS — R7302 Impaired glucose tolerance (oral): Secondary | ICD-10-CM | POA: Diagnosis not present

## 2023-03-07 DIAGNOSIS — I2510 Atherosclerotic heart disease of native coronary artery without angina pectoris: Secondary | ICD-10-CM | POA: Diagnosis not present

## 2023-03-07 DIAGNOSIS — E038 Other specified hypothyroidism: Secondary | ICD-10-CM | POA: Diagnosis not present

## 2023-05-26 ENCOUNTER — Observation Stay
Admission: EM | Admit: 2023-05-26 | Discharge: 2023-05-27 | Disposition: A | Discharge status: Home / Self Care | Payer: PPO | Attending: Internal Medicine | Admitting: Internal Medicine

## 2023-05-26 ENCOUNTER — Other Ambulatory Visit: Payer: Self-pay

## 2023-05-26 ENCOUNTER — Emergency Department: Payer: PPO

## 2023-05-26 DIAGNOSIS — S199XXA Unspecified injury of neck, initial encounter: Secondary | ICD-10-CM | POA: Diagnosis not present

## 2023-05-26 DIAGNOSIS — X58XXXA Exposure to other specified factors, initial encounter: Secondary | ICD-10-CM | POA: Insufficient documentation

## 2023-05-26 DIAGNOSIS — Z87891 Personal history of nicotine dependence: Secondary | ICD-10-CM | POA: Diagnosis not present

## 2023-05-26 DIAGNOSIS — J45909 Unspecified asthma, uncomplicated: Secondary | ICD-10-CM | POA: Diagnosis not present

## 2023-05-26 DIAGNOSIS — I2510 Atherosclerotic heart disease of native coronary artery without angina pectoris: Secondary | ICD-10-CM | POA: Insufficient documentation

## 2023-05-26 DIAGNOSIS — I700 Atherosclerosis of aorta: Secondary | ICD-10-CM | POA: Diagnosis not present

## 2023-05-26 DIAGNOSIS — Z79899 Other long term (current) drug therapy: Secondary | ICD-10-CM | POA: Diagnosis not present

## 2023-05-26 DIAGNOSIS — I10 Essential (primary) hypertension: Secondary | ICD-10-CM | POA: Diagnosis present

## 2023-05-26 DIAGNOSIS — N400 Enlarged prostate without lower urinary tract symptoms: Secondary | ICD-10-CM | POA: Insufficient documentation

## 2023-05-26 DIAGNOSIS — S065X0A Traumatic subdural hemorrhage without loss of consciousness, initial encounter: Secondary | ICD-10-CM | POA: Insufficient documentation

## 2023-05-26 DIAGNOSIS — R079 Chest pain, unspecified: Secondary | ICD-10-CM | POA: Diagnosis not present

## 2023-05-26 DIAGNOSIS — E785 Hyperlipidemia, unspecified: Secondary | ICD-10-CM | POA: Diagnosis not present

## 2023-05-26 DIAGNOSIS — M549 Dorsalgia, unspecified: Secondary | ICD-10-CM | POA: Diagnosis not present

## 2023-05-26 DIAGNOSIS — S0990XA Unspecified injury of head, initial encounter: Secondary | ICD-10-CM | POA: Diagnosis not present

## 2023-05-26 DIAGNOSIS — J9811 Atelectasis: Secondary | ICD-10-CM | POA: Diagnosis not present

## 2023-05-26 DIAGNOSIS — S065XAA Traumatic subdural hemorrhage with loss of consciousness status unknown, initial encounter: Secondary | ICD-10-CM

## 2023-05-26 DIAGNOSIS — R55 Syncope and collapse: Principal | ICD-10-CM | POA: Diagnosis present

## 2023-05-26 DIAGNOSIS — M898X8 Other specified disorders of bone, other site: Secondary | ICD-10-CM | POA: Diagnosis not present

## 2023-05-26 LAB — CBC WITH DIFFERENTIAL/PLATELET
Abs Immature Granulocytes: 0.07 10*3/uL (ref 0.00–0.07)
Basophils Absolute: 0.1 10*3/uL (ref 0.0–0.1)
Basophils Relative: 1 %
Eosinophils Absolute: 0.1 10*3/uL (ref 0.0–0.5)
Eosinophils Relative: 1 %
HCT: 46.9 % (ref 39.0–52.0)
Hemoglobin: 16.3 g/dL (ref 13.0–17.0)
Immature Granulocytes: 1 %
Lymphocytes Relative: 11 %
Lymphs Abs: 1.5 10*3/uL (ref 0.7–4.0)
MCH: 30.9 pg (ref 26.0–34.0)
MCHC: 34.8 g/dL (ref 30.0–36.0)
MCV: 89 fL (ref 80.0–100.0)
Monocytes Absolute: 1 10*3/uL (ref 0.1–1.0)
Monocytes Relative: 7 %
Neutro Abs: 11.4 10*3/uL — ABNORMAL HIGH (ref 1.7–7.7)
Neutrophils Relative %: 79 %
Platelets: 252 10*3/uL (ref 150–400)
RBC: 5.27 MIL/uL (ref 4.22–5.81)
RDW: 13.2 % (ref 11.5–15.5)
WBC: 14.1 10*3/uL — ABNORMAL HIGH (ref 4.0–10.5)
nRBC: 0 % (ref 0.0–0.2)

## 2023-05-26 LAB — BASIC METABOLIC PANEL
Anion gap: 12 (ref 5–15)
BUN: 21 mg/dL (ref 8–23)
CO2: 26 mmol/L (ref 22–32)
Calcium: 9.7 mg/dL (ref 8.9–10.3)
Chloride: 96 mmol/L — ABNORMAL LOW (ref 98–111)
Creatinine, Ser: 0.89 mg/dL (ref 0.61–1.24)
GFR, Estimated: >60 mL/min (ref >60)
Glucose, Bld: 127 mg/dL — ABNORMAL HIGH (ref 70–99)
Potassium: 4.2 mmol/L (ref 3.5–5.1)
Sodium: 134 mmol/L — ABNORMAL LOW (ref 135–145)

## 2023-05-26 LAB — TROPONIN I (HIGH SENSITIVITY)
Troponin I (High Sensitivity): 19 ng/L — ABNORMAL HIGH (ref <18)
Troponin I (High Sensitivity): 24 ng/L — ABNORMAL HIGH (ref <18)

## 2023-05-26 NOTE — ED Provider Notes (Signed)
-----------------------------------------   11:35 PM on 05/26/2023 -----------------------------------------   Electrolytes unremarkable.  Will consult hospital services for evaluation and admission.   Irean Hong, MD 05/27/23 [phone]

## 2023-05-26 NOTE — ED Triage Notes (Signed)
Pt to ED via pov c/o fall. Pt reports he was outside cutting wood when he suddenly fell. Pt reports waking up on the floor, doesn't remember why he fell. Denies tripping on anything. Pt endorses CP, SHOB with deep breaths, back pain.

## 2023-05-26 NOTE — ED Provider Notes (Addendum)
[facility] Provider Note    Event Date/Time   First MD Initiated Contact with Patient 05/26/23 2111     (approximate)   History   Fall   HPI Shawn Lowery is a 78 y.o. male with history of HTN, HLD presenting today for syncope.  Patient reports that he was cutting wood at his house when next thing he knew he woke up on the ground.  Had some pain to the back of his head as well as his lower back.  Does not remember falling.  Not on blood thinners.  Around the time of the event denies palpitations, chest pain, shortness of breath, lightheadedness, dizziness.  No prior episodes like this.  No recent lightheadedness or dizziness.  Denies pain symptoms elsewhere.  Has ambulated but wife reports that has been difficult since then.     Physical Exam   Triage Vital Signs: ED Triage Vitals  Encounter Vitals Group     BP 05/26/23 1924 (!) 151/85     Systolic BP Percentile --      Diastolic BP Percentile --      Pulse Rate 05/26/23 1924 72     Resp 05/26/23 1924 16     Temp 05/26/23 1924 97.7 F (36.5 C)     Temp Source 05/26/23 1924 Oral     SpO2 05/26/23 1924 96 %     Weight 05/26/23 1920 202 lb (91.6 kg)     Height 05/26/23 1920 5\' 11"  (1.803 m)     Head Circumference --      Peak Flow --      Pain Score 05/26/23 1920 0     Pain Loc --      Pain Education --      Exclude from Growth Chart --     Most recent vital signs: Vitals:   05/26/23 1924  BP: (!) 151/85  Pulse: 72  Resp: 16  Temp: 97.7 F (36.5 C)  SpO2: 96%   Physical Exam: I have reviewed the vital signs and nursing notes. General: Awake, alert, no acute distress.  Nontoxic appearing. Head:  Atraumatic, normocephalic.   ENT:  EOM intact, PERRL. Oral mucosa is pink and moist with no lesions. Neck: Neck is supple with full range of motion, No meningeal signs. Cardiovascular:  RRR, No murmurs. Peripheral pulses palpable and equal bilaterally. Respiratory:  Symmetrical chest wall  expansion.  No rhonchi, rales, or wheezes.  Good air movement throughout.  No use of accessory muscles.   Musculoskeletal:  No cyanosis or edema. Moving extremities with full ROM.  No tenderness to C, T, or L-spine. Abdomen:  Soft, nontender, nondistended. Neuro:  GCS 15, moving all four extremities, interacting appropriately. Speech clear. Psych:  Calm, appropriate.   Skin:  Warm, dry, no rash.    ED Results / Procedures / Treatments   Labs (all labs ordered are listed, but only abnormal results are displayed) Labs Reviewed  CBC WITH DIFFERENTIAL/PLATELET - Abnormal; Notable for the following components:      Result Value   WBC 14.1 (*)    Neutro Abs 11.4 (*)    All other components within normal limits  TROPONIN I (HIGH SENSITIVITY) - Abnormal; Notable for the following components:   Troponin I (High Sensitivity) 19 (*)    All other components within normal limits  BASIC METABOLIC PANEL  TROPONIN I (HIGH SENSITIVITY)     EKG My EKG interpretation: Rate of 76, sinus rhythm with first-degree AV block.  Normal axis, normal intervals.  No acute ST elevations or depressions   RADIOLOGY Independently interpreted chest x-ray with no acute pathology.  Independently interpreted CT cervical spine with no acute pathology.  Independently interpreted CT head with possible subdural hematoma in the posterior falx   PROCEDURES:  Critical Care performed: Yes, see critical care procedure note(s)  .Critical Care  Performed by: Janith Lima, MD Authorized by: Janith Lima, MD   Critical care provider statement:    Critical care time (minutes):  30   Critical care was necessary to treat or prevent imminent or life-threatening deterioration of the following conditions: subdural hematoma.   Critical care was time spent personally by me on the following activities:  Development of treatment plan with patient or surrogate, discussions with consultants, evaluation of patient's response to  treatment, examination of patient, ordering and review of laboratory studies, ordering and review of radiographic studies, ordering and performing treatments and interventions, pulse oximetry, re-evaluation of patient's condition and review of old charts   I assumed direction of critical care for this patient from another provider in my specialty: no     Care discussed with: admitting provider      MEDICATIONS ORDERED IN ED: Medications - No data to display   IMPRESSION / MDM / ASSESSMENT AND PLAN / ED COURSE  I reviewed the triage vital signs and the nursing notes.                              Differential diagnosis includes, but is not limited to, ICH, cervical spine injury, cardiac arrhythmia, electrolyte abnormality, carotid stenosis, rib fracture  Patient's presentation is most consistent with acute presentation with potential threat to life or bodily function.  Patient is a 78 year old male presenting today for syncopal episode with no prodrome and resulting head injury.  Vital signs are stable and physical exam with no acute neurological deficits.  EKG without obvious notable arrhythmia.  CBC with slight leukocytosis but no other obvious infectious symptoms.  Radiologist called regarding CT head findings with possible subdural hematoma.  Discussed the case with neurosurgery.  They recommended repeat head CT 6 hours from the initial.  Will complete patient's blood work and then likely plan for admission given syncope with no prodromal symptoms as well as following up repeat CT head to rule out ICH.  Neurosurgery will evaluate patient in the morning.  Patient signed out pending completion of laboratory workup.  The patient is on the cardiac monitor to evaluate for evidence of arrhythmia and/or significant heart rate changes. Clinical Course as of 05/26/23 2307  Mon May 26, 2023  2209 Neurosurgery recommends repeat Head CT in 6 hours [DW]    Clinical Course User Index [DW] Janith Lima, MD     FINAL CLINICAL IMPRESSION(S) / ED DIAGNOSES   Final diagnoses:  Subdural hematoma (HCC)  Syncope and collapse     Rx / DC Orders   ED Discharge Orders     None        Note:  This document was prepared using Dragon voice recognition software and may include unintentional dictation errors.   Janith Lima, MD 05/26/23 2307    Janith Lima, MD 05/26/23 [phone]

## 2023-05-26 NOTE — ED Provider Triage Note (Signed)
Emergency Medicine Provider Triage Evaluation Note  Shawn Lowery , a 78 y.o. male  was evaluated in triage. Pt complains of fall. Doesn't remember what caused him to fall. He woke up on the ground. Wife states he has been forgetful.   Review of Systems  Positive: Cp, lower back pain, neck pain Negative:   Physical Exam  Ht 5\' 11"  (1.803 m)   Wt 91.6 kg   BMI 28.17 kg/m  Gen:   Awake, no distress   Resp:  Normal effort  MSK:   Moves extremities without difficulty  Other:  Chest pain is reproducible, no ttp over the lumbar spine  Medical Decision Making  Medically screening exam initiated at 7:24 PM.  Appropriate orders placed.  Shawn Lowery was informed that the remainder of the evaluation will be completed by another provider, this initial triage assessment does not replace that evaluation, and the importance of remaining in the ED until their evaluation is complete.     Cameron Ali, PA-C 05/26/23 1926

## 2023-05-27 ENCOUNTER — Telehealth: Payer: Self-pay | Admitting: Neurosurgery

## 2023-05-27 ENCOUNTER — Other Ambulatory Visit: Payer: Self-pay

## 2023-05-27 ENCOUNTER — Observation Stay (HOSPITAL_BASED_OUTPATIENT_CLINIC_OR_DEPARTMENT_OTHER)
Admit: 2023-05-27 | Discharge: 2023-05-27 | Disposition: A | Discharge status: Home / Self Care | Payer: PPO | Attending: Family Medicine

## 2023-05-27 ENCOUNTER — Observation Stay: Payer: PPO

## 2023-05-27 DIAGNOSIS — R55 Syncope and collapse: Secondary | ICD-10-CM | POA: Diagnosis present

## 2023-05-27 DIAGNOSIS — N400 Enlarged prostate without lower urinary tract symptoms: Secondary | ICD-10-CM | POA: Insufficient documentation

## 2023-05-27 DIAGNOSIS — S065XAA Traumatic subdural hemorrhage with loss of consciousness status unknown, initial encounter: Secondary | ICD-10-CM

## 2023-05-27 DIAGNOSIS — I10 Essential (primary) hypertension: Secondary | ICD-10-CM

## 2023-05-27 DIAGNOSIS — I609 Nontraumatic subarachnoid hemorrhage, unspecified: Secondary | ICD-10-CM | POA: Diagnosis not present

## 2023-05-27 DIAGNOSIS — E785 Hyperlipidemia, unspecified: Secondary | ICD-10-CM | POA: Diagnosis not present

## 2023-05-27 LAB — CBC
HCT: 44.9 % (ref 39.0–52.0)
Hemoglobin: 15.4 g/dL (ref 13.0–17.0)
MCH: 31.2 pg (ref 26.0–34.0)
MCHC: 34.3 g/dL (ref 30.0–36.0)
MCV: 91.1 fL (ref 80.0–100.0)
Platelets: 234 10*3/uL (ref 150–400)
RBC: 4.93 MIL/uL (ref 4.22–5.81)
RDW: 13.3 % (ref 11.5–15.5)
WBC: 10.3 10*3/uL (ref 4.0–10.5)
nRBC: 0 % (ref 0.0–0.2)

## 2023-05-27 LAB — ECHOCARDIOGRAM COMPLETE
AR max vel: 2.59 cm2
AV Area VTI: 2.42 cm2
AV Area mean vel: 2.38 cm2
AV Mean grad: 5 mm[Hg]
AV Peak grad: 8.2 mm[Hg]
Ao pk vel: 1.43 m/s
Area-P 1/2: 4.06 cm2
Est EF: >55
Height: 71 in
MV VTI: 2.74 cm2
S' Lateral: 3.8 cm
Weight: 3232 [oz_av]

## 2023-05-27 LAB — BASIC METABOLIC PANEL
Anion gap: 12 (ref 5–15)
BUN: 21 mg/dL (ref 8–23)
CO2: 26 mmol/L (ref 22–32)
Calcium: 8.6 mg/dL — ABNORMAL LOW (ref 8.9–10.3)
Chloride: 100 mmol/L (ref 98–111)
Creatinine, Ser: 0.81 mg/dL (ref 0.61–1.24)
GFR, Estimated: >60 mL/min (ref >60)
Glucose, Bld: 146 mg/dL — ABNORMAL HIGH (ref 70–99)
Potassium: 3.2 mmol/L — ABNORMAL LOW (ref 3.5–5.1)
Sodium: 138 mmol/L (ref 135–145)

## 2023-05-27 MED ORDER — ACETAMINOPHEN 325 MG PO TABS
650.0000 mg | ORAL_TABLET | Freq: Four times a day (QID) | ORAL | Status: DC | PRN
  Administered 2023-05-27 (×2): 650 mg via ORAL
  Filled 2023-05-27 (×2): qty 2

## 2023-05-27 MED ORDER — OXYCODONE HCL 5 MG PO TABS
5.0000 mg | ORAL_TABLET | ORAL | Status: DC | PRN
Start: 2023-05-27 — End: 2023-05-27
  Administered 2023-05-27: 10 mg via ORAL
  Filled 2023-05-27: qty 2

## 2023-05-27 MED ORDER — PRAVASTATIN SODIUM 20 MG PO TABS: 20.0000 mg | ORAL_TABLET | Freq: Every day | ORAL | Status: DC

## 2023-05-27 MED ORDER — AMLODIPINE BESYLATE 5 MG PO TABS
5.0000 mg | ORAL_TABLET | Freq: Every day | ORAL | Status: DC
  Administered 2023-05-27: 5 mg via ORAL
  Filled 2023-05-27: qty 1

## 2023-05-27 MED ORDER — ONDANSETRON HCL 4 MG/2ML IJ SOLN: 4.0000 mg | Freq: Four times a day (QID) | INTRAMUSCULAR | Status: DC | PRN

## 2023-05-27 MED ORDER — TAMSULOSIN HCL 0.4 MG PO CAPS: 0.4000 mg | ORAL_CAPSULE | Freq: Every day | ORAL | Status: DC

## 2023-05-27 MED ORDER — ADULT MULTIVITAMIN W/MINERALS CH: 1.0000 | ORAL_TABLET | ORAL | Status: DC

## 2023-05-27 MED ORDER — ONDANSETRON HCL 4 MG PO TABS: 4.0000 mg | ORAL_TABLET | Freq: Four times a day (QID) | ORAL | Status: DC | PRN

## 2023-05-27 MED ORDER — HYDROCHLOROTHIAZIDE 25 MG PO TABS
25.0000 mg | ORAL_TABLET | Freq: Every day | ORAL | Status: DC
  Administered 2023-05-27: 25 mg via ORAL
  Filled 2023-05-27: qty 1

## 2023-05-27 MED ORDER — SODIUM CHLORIDE 0.9% FLUSH
3.0000 mL | Freq: Two times a day (BID) | INTRAVENOUS | Status: DC
  Administered 2023-05-27 (×2): 3 mL via INTRAVENOUS

## 2023-05-27 MED ORDER — LISINOPRIL-HYDROCHLOROTHIAZIDE 20-25 MG PO TABS: 1.0000 | ORAL_TABLET | Freq: Every day | ORAL | Status: DC

## 2023-05-27 MED ORDER — ACETAMINOPHEN 325 MG RE SUPP: 650.0000 mg | Freq: Four times a day (QID) | RECTAL | Status: DC | PRN

## 2023-05-27 MED ORDER — MAGNESIUM HYDROXIDE 400 MG/5ML PO SUSP: 30.0000 mL | Freq: Every day | ORAL | Status: DC | PRN

## 2023-05-27 MED ORDER — TRAZODONE HCL 50 MG PO TABS: 25.0000 mg | ORAL_TABLET | Freq: Every evening | ORAL | Status: DC | PRN

## 2023-05-27 MED ORDER — OXYCODONE HCL 5 MG PO TABS: 5.0000 mg | ORAL_TABLET | Freq: Four times a day (QID) | ORAL | 0 refills | Status: AC | PRN

## 2023-05-27 MED ORDER — LISINOPRIL 10 MG PO TABS
20.0000 mg | ORAL_TABLET | Freq: Every day | ORAL | Status: DC
  Administered 2023-05-27: 20 mg via ORAL
  Filled 2023-05-27: qty 2

## 2023-05-27 MED ORDER — ACETAMINOPHEN 325 MG PO TABS
ORAL_TABLET | ORAL | Status: AC
  Administered 2023-05-27: 650 mg via ORAL
  Filled 2023-05-27: qty 2

## 2023-05-27 MED ORDER — SODIUM CHLORIDE 0.9 % IV SOLN: INTRAVENOUS | Status: DC

## 2023-05-27 MED ORDER — FINASTERIDE 5 MG PO TABS
5.0000 mg | ORAL_TABLET | Freq: Every day | ORAL | Status: DC
  Administered 2023-05-27: 5 mg via ORAL
  Filled 2023-05-27: qty 1

## 2023-05-27 MED ORDER — MOMETASONE FURO-FORMOTEROL FUM 100-5 MCG/ACT IN AERO
2.0000 | INHALATION_SPRAY | Freq: Two times a day (BID) | RESPIRATORY_TRACT | Status: DC
  Administered 2023-05-27: 2 via RESPIRATORY_TRACT
  Filled 2023-05-27: qty 8.8

## 2023-05-27 NOTE — Assessment & Plan Note (Signed)
-  We will continue Flomax

## 2023-05-27 NOTE — H&P (Signed)
[place]   PATIENT NAME: Shawn Lowery    MR#:  [id number]  DATE OF BIRTH:  Shawn Lowery 27, 1947  DATE OF ADMISSION:  05/26/2023  PRIMARY CARE PHYSICIAN: Myrene Buddy, NP   Patient is coming from: Home  REQUESTING/REFERRING PHYSICIAN: Chiquita Loth, MD  CHIEF COMPLAINT:   Chief Complaint  Patient presents with   Fall    HISTORY OF PRESENT ILLNESS:  Shawn Lowery is a 78 y.o. Caucasian male with medical history significant for asthma, CAD, hypertension, dyslipidemia and osteoarthritis, who presented to the emergency room with acute onset of syncope while working in his garage with occipital head injury per his report.  He denied any paresthesias or focal muscle weakness.  No chest pain or palpitations.  No nausea or vomiting or abdominal pain.  He woke up on the ground.  He denied any fever or chills.  No dysuria, oliguria or hematuria or flank pain.  No cough or wheezing or hemoptysis.  No other bleeding diathesis.  ED Course: When he came to the ER, BP was 151/85 with otherwise normal vital signs.  Labs revealed mild hyponatremia 134 and hypochloremia of 96 with a blue blood glucose of 127.  CBC showed leukocytosis of 14.1 with neutrophilia. EKG as reviewed by me : With to the ER EKG showed sinus rhythm with rate of 76 with first-degree AV block. Imaging: Noncontrasted head CT scan revealed suspected small 2 to 3 mm thick acute subdural hemorrhage along the posterior falx and right tentorial leaflet with no significant mass effect.  C-spine CT showed rotation of C1 on C2 likely positional in the absence of a fixed torticollis and no evidence for fracture.  Two-view chest x-ray showed no acute cardiopulmonary disease.  It showed aortic atherosclerosis.  Repeat head CT 6 hours later showed no substantial change and 3 mm thick subdural hemorrhage laying along the posterior falx and right tentorial leaflet with.  It showed new trace overlying right occipital subarachnoid hemorrhage.  Dr.  Katrinka Blazing with notified about the patient.  The patient will be admitted to a unit observation bed for further evaluation and management. PAST MEDICAL HISTORY:   Past Medical History:  Diagnosis Date   Arthritis    Asthma    CAD (coronary artery disease)    Diverticulitis    Diverticulosis    Hyperlipemia    Hypertension    IGT (impaired glucose tolerance)    Rectal bleeding     PAST SURGICAL HISTORY:   Past Surgical History:  Procedure Laterality Date   COLONOSCOPY WITH PROPOFOL N/A 02/10/2015   Procedure: COLONOSCOPY WITH PROPOFOL;  Surgeon: Christena Deem, MD;  Location: [facility] ENDOSCOPY;  Service: Endoscopy;  Laterality: N/A;   COLONOSCOPY WITH PROPOFOL N/A 02/13/2015   Procedure: COLONOSCOPY WITH PROPOFOL;  Surgeon: Christena Deem, MD;  Location: [facility] ENDOSCOPY;  Service: Endoscopy;  Laterality: N/A;   HERNIA REPAIR      SOCIAL HISTORY:   Social History   Tobacco Use   Smoking status: Former    Current packs/day: 1.00    Average packs/day: 1 pack/day for 15.0 years (15.0 ttl pk-yrs)    Types: Cigarettes   Smokeless tobacco: Former    Quit date: 12/31/1983  Substance Use Topics   Alcohol use: No    FAMILY HISTORY:   Family History  Problem Relation Age of Onset   Hypertension Father    CAD Father    Stroke Mother     DRUG ALLERGIES:  No Known Allergies  REVIEW OF SYSTEMS:   ROS As per history of present illness. All pertinent systems were reviewed above. Constitutional, HEENT, cardiovascular, respiratory, GI, GU, musculoskeletal, neuro, psychiatric, endocrine, integumentary and hematologic systems were reviewed and are otherwise negative/unremarkable except for positive findings mentioned above in the HPI.   MEDICATIONS AT HOME:   Prior to Admission medications   Medication Sig Start Date End Date Taking? Authorizing Provider  amLODipine (NORVASC) 5 MG tablet Take 5 mg by mouth daily.    [provider]  finasteride (PROSCAR) 5 MG tablet  Take 5 mg by mouth daily.    [provider]  Fluticasone-Salmeterol (ADVAIR) 100-50 MCG/DOSE AEPB Inhale 1 puff into the lungs 2 (two) times daily as needed (for wheezing or shortness of breath).     [provider]  lisinopril-hydrochlorothiazide (PRINZIDE,ZESTORETIC) 20-25 MG per tablet Take 1 tablet by mouth daily.    [provider]  lovastatin (MEVACOR) 20 MG tablet Take 20 mg by mouth daily.     [provider]  Multiple Vitamin (MULTIVITAMIN WITH MINERALS) TABS tablet Take 1 tablet by mouth every Monday, Wednesday, and Friday.    [provider]  naproxen (NAPROSYN) 500 MG tablet Take 1 tablet (500 mg total) by mouth 2 (two) times daily with a meal. 10/25/22   Brimage, Seward Meth, DO  tamsulosin (FLOMAX) 0.4 MG CAPS capsule TAKE ONE CAPSULE BY MOUTH ONCE DAILY AFTER SUPPER 02/21/15   Fernanda Drum, FNP      VITAL SIGNS:  Blood pressure (!) 140/72, pulse (!) 57, temperature 98.6 F (37 C), temperature source Oral, resp. rate 12, height 5\' 11"  (1.803 m), weight 91.6 kg, SpO2 94%.  PHYSICAL EXAMINATION:  Physical Exam  GENERAL:  78 y.o.-year-old Caucasian male patient lying in the bed with no acute distress.  EYES: Pupils equal, round, reactive to light and accommodation. No scleral icterus. Extraocular muscles intact.  HEENT: Head atraumatic, normocephalic. Oropharynx and nasopharynx clear.  NECK:  Supple, no jugular venous distention. No thyroid enlargement, no tenderness.  LUNGS: Normal breath sounds bilaterally, no wheezing, rales,rhonchi or crepitation. No use of accessory muscles of respiration.  CARDIOVASCULAR: Regular rate and rhythm, S1, S2 normal. No murmurs, rubs, or gallops.  ABDOMEN: Soft, nondistended, nontender. Bowel sounds present. No organomegaly or mass.  EXTREMITIES: No pedal edema, cyanosis, or clubbing.  NEUROLOGIC: Cranial nerves II through XII are intact. Muscle strength 5/5 in all extremities. Sensation intact. Gait  not checked.  PSYCHIATRIC: The patient is alert and oriented x 3.  Normal affect and good eye contact. SKIN: No obvious rash, lesion, or ulcer.   LABORATORY PANEL:   CBC Recent Labs  Lab 05/27/23 0145  WBC 10.3  HGB 15.4  HCT 44.9  PLT 234   ------------------------------------------------------------------------------------------------------------------  Chemistries  Recent Labs  Lab 05/27/23 0145  NA 138  K 3.2*  CL 100  CO2 26  GLUCOSE 146*  BUN 21  CREATININE 0.81  CALCIUM 8.6*   ------------------------------------------------------------------------------------------------------------------  Cardiac Enzymes No results for input(s): "TROPONINI" in the last 168 hours. ------------------------------------------------------------------------------------------------------------------  RADIOLOGY:  CT Head Wo Contrast Result Date: 05/27/2023 CLINICAL DATA:  Follow up head CT to monitor possible SDH in posterior falx EXAM: CT HEAD WITHOUT CONTRAST TECHNIQUE: Contiguous axial images were obtained from the base of the skull through the vertex without intravenous contrast. RADIATION DOSE REDUCTION: This exam was performed according to the departmental dose-optimization program which includes automated exposure control, adjustment of the mA and/or kV according to patient size and/or use of iterative reconstruction  technique. COMPARISON:  CT head 05/26/2023 FINDINGS: Brain: No substantial change in 3 mm thick subdural hemorrhage layering along the posterior falx and right tentorial leaflet. No evidence of acute large vascular territory infarct, mass lesion, midline shift or hydrocephalus. Vascular: No hyperdense vessel identified. Skull: No acute fracture. Sinuses/Orbits: Moderate paranasal sinus mucosal thickening. No acute orbital findings. Other: No mastoid effusions. IMPRESSION: 1. No substantial change in 3 mm thick subdural hemorrhage layering along the posterior falx and right  tentorial leaflet. 2. New trace overlying right occipital subarachnoid hemorrhage. Electronically Signed   By: Feliberto Harts M.D.   On: 05/27/2023 02:12   DG Chest 2 View Result Date: 05/26/2023 CLINICAL DATA:  Chest pain. outside cutting wood when he suddenly fell. Pt reports waking up on the floor, doesn't remember why he fell. Denies tripping on anything. Pt endorses CP, SHOB with deep breaths, back pain. EXAM: CHEST - 2 VIEW COMPARISON:  Chest x-ray 12/24/2014 FINDINGS: The heart and mediastinal contours are within normal limits. Atherosclerotic plaque Left base atelectasis. No focal consolidation. No pulmonary edema. No pleural effusion. No pneumothorax. No acute osseous abnormality. IMPRESSION: 1. No active cardiopulmonary disease. 2.  Aortic Atherosclerosis (ICD10-I70.0). Electronically Signed   By: Tish Frederickson M.D.   On: 05/26/2023 20:40   CT Head Wo Contrast Result Date: 05/26/2023 CLINICAL DATA:  Head trauma, minor (Age >= 65y); Neck trauma (Age >= 65y) EXAM: CT HEAD WITHOUT CONTRAST CT CERVICAL SPINE WITHOUT CONTRAST TECHNIQUE: Multidetector CT imaging of the head and cervical spine was performed following the standard protocol without intravenous contrast. Multiplanar CT image reconstructions of the cervical spine were also generated. RADIATION DOSE REDUCTION: This exam was performed according to the departmental dose-optimization program which includes automated exposure control, adjustment of the mA and/or kV according to patient size and/or use of iterative reconstruction technique. COMPARISON:  None Available. FINDINGS: CT HEAD FINDINGS Brain: Suspected small (2-3 mm thick) acute subdural hemorrhage along the posterior falx and right tentorial leaflet (for example see series 4, images 50/51). No evidence of acute large vascular territory infarct, hydrocephalus, extra-axial collection or mass lesion/mass effect. Vascular: Calcific atherosclerosis. Skull: No acute fracture.  Sinuses/Orbits: Moderate paranasal sinus mucosal thickening. No acute orbital findings. Other: No mastoid effusions. CT CERVICAL SPINE FINDINGS Alignment: Rotation of C1 on C2. No substantial sagittal subluxation. Skull base and vertebrae: No evidence of acute fracture. Sclerotic bony lesion at T3 is most likely a benign bone island in the absence of a known primary malignancy. Soft tissues and spinal canal: No prevertebral fluid or swelling. No visible canal hematoma. Disc levels: Degenerative disc disease is greatest and moderate at C6-C7. Bridging anterior osteophytes in the upper cervical spine. Upper chest: Visualized lung apices are clear. Other: Calcific atherosclerosis. IMPRESSION: CT head: Suspected small (2-3 mm thick) acute subdural hemorrhage along the posterior falx and right tentorial leaflet. No significant mass effect. CT cervical spine: 1. No evidence of acute fracture. 2. Rotation of C1 on C2, likely positional in the absence of a fixed torticollis. Findings discussed with Dr. Anner Crete via telephone at 8:12 p.m. Electronically Signed   By: Feliberto Harts M.D.   On: 05/26/2023 20:14   CT Cervical Spine Wo Contrast Result Date: 05/26/2023 CLINICAL DATA:  Head trauma, minor (Age >= 65y); Neck trauma (Age >= 65y) EXAM: CT HEAD WITHOUT CONTRAST CT CERVICAL SPINE WITHOUT CONTRAST TECHNIQUE: Multidetector CT imaging of the head and cervical spine was performed following the standard protocol without intravenous contrast. Multiplanar CT image reconstructions of the cervical  spine were also generated. RADIATION DOSE REDUCTION: This exam was performed according to the departmental dose-optimization program which includes automated exposure control, adjustment of the mA and/or kV according to patient size and/or use of iterative reconstruction technique. COMPARISON:  None Available. FINDINGS: CT HEAD FINDINGS Brain: Suspected small (2-3 mm thick) acute subdural hemorrhage along the posterior falx and right  tentorial leaflet (for example see series 4, images 50/51). No evidence of acute large vascular territory infarct, hydrocephalus, extra-axial collection or mass lesion/mass effect. Vascular: Calcific atherosclerosis. Skull: No acute fracture. Sinuses/Orbits: Moderate paranasal sinus mucosal thickening. No acute orbital findings. Other: No mastoid effusions. CT CERVICAL SPINE FINDINGS Alignment: Rotation of C1 on C2. No substantial sagittal subluxation. Skull base and vertebrae: No evidence of acute fracture. Sclerotic bony lesion at T3 is most likely a benign bone island in the absence of a known primary malignancy. Soft tissues and spinal canal: No prevertebral fluid or swelling. No visible canal hematoma. Disc levels: Degenerative disc disease is greatest and moderate at C6-C7. Bridging anterior osteophytes in the upper cervical spine. Upper chest: Visualized lung apices are clear. Other: Calcific atherosclerosis. IMPRESSION: CT head: Suspected small (2-3 mm thick) acute subdural hemorrhage along the posterior falx and right tentorial leaflet. No significant mass effect. CT cervical spine: 1. No evidence of acute fracture. 2. Rotation of C1 on C2, likely positional in the absence of a fixed torticollis. Findings discussed with Dr. Anner Crete via telephone at 8:12 p.m. Electronically Signed   By: Feliberto Harts M.D.   On: 05/26/2023 20:14      IMPRESSION AND PLAN:  Assessment and Plan: * Syncope - The patient will be admitted to an observation progressive unit bed. - Will check orthostatics q 12 hours. - Will obtain a bilateral carotid Doppler and 2D echo. - The patient will be gently hydrated with IV normal saline and monitored for arrhythmias. -Differential diagnoses would include neurally mediated syncope, cardiogenic, arrhythmias related,  orthostatic hypotension and less likely hypoglycemia.    Subdural hematoma (HCC) - This is very small and did not change with repeat head CT scan. -  Neurosurgery consult will be obtained. - Dr. Katrinka Blazing was notified about the patient. - We will follow neurochecks every 4 hours for 24 hours. - Will maintain BP control especially with new subarachnoid overlying hemorrhage in the occipital area..  Essential hypertension - We will maintain systolic BP less than 160. - Antihypertensives will be continued. - He will be placed on as needed IV labetalol.  Dyslipidemia - We will continue PPI therapy.  BPH (benign prostatic hyperplasia) - We will continue Flomax.   DVT prophylaxis: SCDs Advanced Care Planning:  Code Status: full code.  Family Communication:  The plan of care was discussed in details with the patient (and family). I answered all questions. The patient agreed to proceed with the above mentioned plan. Further management will depend upon hospital course. Disposition Plan: Back to previous home environment Consults called: Neurosurgery and neurology All the records are reviewed and case discussed with ED provider.  Status is: Observation  I certify that at the time of admission, it is my clinical judgment that the patient will require hospital care extending less than 2 midnights.                            Dispo: The patient is from: Home              Anticipated d/c is to: Home  Patient currently is not medically stable to d/c.              Difficult to place patient: No  Hannah Beat M.D on 05/27/2023 at 6:08 AM  [facility]   From 7 PM-7 AM, contact night-coverage [email or web address]  CC: Primary care physician; Gauger, Hermenia Fiscal, NP

## 2023-05-27 NOTE — Telephone Encounter (Signed)
CT Head w/o contrast ordered. Patient needs to have scan prior to office visit. Ok to schedule patient.

## 2023-05-27 NOTE — Assessment & Plan Note (Addendum)
-   We will maintain systolic BP less than 160. - Antihypertensives will be continued. - He will be placed on as needed IV labetalol.

## 2023-05-27 NOTE — Progress Notes (Signed)
*  PRELIMINARY RESULTS* Echocardiogram 2D Echocardiogram has been performed.  Carolyne Fiscal 05/27/2023, 12:44 PM

## 2023-05-27 NOTE — Discharge Summary (Signed)
Physician Discharge Summary  Shawn Lowery ZOX:[id number] DOB: 12/04/1945 DOA: 05/26/2023  PCP: Shawn Buddy, Lowery  Admit date: 05/26/2023 Discharge date: 05/27/2023  Admitted From: Home Disposition:  Home  Recommendations for Outpatient Follow-up:  Follow up with PCP in 1-2 weeks Follow-up outpatient neurosurgery 3 to 4 weeks  Home Health: No Equipment/Devices: None  Discharge Condition: Stable CODE STATUS: Full Diet recommendation: Regular  Brief/Interim Summary: 77 y.o. Caucasian male with medical history significant for asthma, CAD, hypertension, dyslipidemia and osteoarthritis, who presented to the emergency room with acute onset of syncope while working in his garage with occipital head injury per his report.  He denied any paresthesias or focal muscle weakness.  No chest pain or palpitations.  No nausea or vomiting or abdominal pain.  He woke up on the ground.  He denied any fever or chills.  No dysuria, oliguria or hematuria or flank pain.  No cough or wheezing or hemoptysis.  No other bleeding diathesis.   Seen in consultation in follow-up by neurosurgery.  No changes noted on follow-up CAT scan.  No urgent surgical intervention warranted.  Neurosurgery recommends follow-up in office in 3 to 4 weeks with repeat CAT scan to ensure resolution of bleed.  Seen by PT and OT.  Ambulated successfully without syncopal or presyncopal event.  I suspect a vasovagal event in the setting of heavy lifting in combination with for p.o. intake and hydration.  No recurrence of such events during admission.  Stable for discharge home at this time.    Discharge Diagnoses:  Principal Problem:   Syncope Active Problems:   Subdural hematoma (HCC)   Essential hypertension   Dyslipidemia   BPH (benign prostatic hyperplasia)  * Syncope I suspect vasovagal in the setting of heavy lifting in the setting of poor p.o. intake and poor hydration.  No recurrence of such events.  Patient not  orthostatic.  Ambulated with PT and OT without difficulty.  Stable for discharge home.       Subdural hematoma (HCC) Followed by serial CAT scans every 6 hours.  No appreciable change in size of 3 mm subdural hematoma.  Seen by neurosurgery Dr. Katrinka Lowery.  No urgent surgical intervention warranted.  Patient will follow-up in neurosurgical office 3 to 4 weeks postdischarge.    Discharge Instructions  Discharge Instructions     Diet - low sodium heart healthy   Complete by: As directed    Increase activity slowly   Complete by: As directed       Allergies as of 05/27/2023   No Known Allergies      Medication List     TAKE these medications    albuterol 108 (90 Base) MCG/ACT inhaler Commonly known as: VENTOLIN HFA Inhale 1 puff into the lungs every 6 (six) hours as needed for wheezing or shortness of breath.   amLODipine 5 MG tablet Commonly known as: NORVASC Take 5 mg by mouth daily.   diclofenac 75 MG EC tablet Commonly known as: VOLTAREN Take 75 mg by mouth daily as needed for mild pain (pain score 1-3).   finasteride 5 MG tablet Commonly known as: PROSCAR Take 5 mg by mouth daily.   fluticasone 50 MCG/ACT nasal spray Commonly known as: FLONASE Place 1 spray into both nostrils daily.   Fluticasone-Salmeterol 100-50 MCG/DOSE Aepb Commonly known as: ADVAIR Inhale 1 puff into the lungs 2 (two) times daily as needed (for wheezing or shortness of breath).   ipratropium 0.06 % nasal spray Commonly known as: ATROVENT  Place 2 sprays into the nose 4 (four) times daily.   lisinopril-hydrochlorothiazide 20-25 MG tablet Commonly known as: ZESTORETIC Take 1 tablet by mouth daily.   lovastatin 20 MG tablet Commonly known as: MEVACOR Take 20 mg by mouth daily.   multivitamin with minerals Tabs tablet Take 1 tablet by mouth every Monday, Wednesday, and Friday.   naproxen 500 MG tablet Commonly known as: NAPROSYN Take 1 tablet (500 mg total) by mouth 2 (two) times  daily with a meal.   oxyCODONE 5 MG immediate release tablet Commonly known as: Oxy IR/ROXICODONE Take 1 tablet (5 mg total) by mouth every 6 (six) hours as needed for severe pain (pain score 7-10).   tamsulosin 0.4 MG Caps capsule Commonly known as: FLOMAX TAKE ONE CAPSULE BY MOUTH ONCE DAILY AFTER SUPPER        Follow-up Information     Shawn Lowery. Schedule an appointment as soon as possible for a visit in 1 week(s).   Specialty: Internal Medicine Contact information: [address] [place] [address] [phone]         Shawn Lowery. Schedule an appointment as soon as possible for a visit in 3 week(s).   Specialty: Neurosurgery Why: Follow up in Dr. Michaelle Copas office within 3-4 weeks Contact information: [address] [place] [address] [phone]                No Known Allergies  Consultations: Neurosurgery   Procedures/Studies: ECHOCARDIOGRAM COMPLETE Result Date: 05/27/2023    ECHOCARDIOGRAM REPORT   Patient Name:   Shawn Lowery Date of Exam: 05/27/2023 Medical Rec #:  [id number]    Height:       71.0 in Accession #:    [id number]   Weight:       202.0 lb Date of Birth:  06-05-1945     BSA:          2.117 m Patient Age:    77 years     BP:           140/72 mmHg Patient Gender: M            HR:           66 bpm. Exam Location:  [facility] Procedure: 2D Echo, Cardiac Doppler and Color Doppler Indications:     Syncope  History:         Patient has no prior history of Echocardiogram examinations.                  CAD; Signs/Symptoms:Syncope.  Sonographer:     Shawn Lowery Referring Phys:  [id number] Shawn Lowery Diagnosing Phys: Shawn Lowery  Sonographer Comments: Technically difficult study due to poor echo windows. Patient is unable to lie on left side due to back injury. IMPRESSIONS  1. Left ventricular ejection fraction, by estimation, is >55%. The left ventricle has normal function. Left ventricular endocardial border  not optimally defined to evaluate regional wall motion. There is moderate left ventricular hypertrophy. Left ventricular diastolic parameters are consistent with Grade II diastolic dysfunction (pseudonormalization).  2. Right ventricular systolic function is normal. The right ventricular size is mildly enlarged. Tricuspid regurgitation signal is inadequate for assessing PA pressure.  3. The mitral valve is grossly normal. Trivial mitral valve regurgitation.  4. The aortic valve was not well visualized. Aortic valve regurgitation is not visualized. No aortic stenosis is present. FINDINGS  Left Ventricle: Left ventricular ejection fraction, by estimation, is >  55%. The left ventricle has normal function. Left ventricular endocardial border not optimally defined to evaluate regional wall motion. The left ventricular internal cavity size was  normal in size. There is moderate left ventricular hypertrophy. Left ventricular diastolic parameters are consistent with Grade II diastolic dysfunction (pseudonormalization). Right Ventricle: The right ventricular size is mildly enlarged. No increase in right ventricular wall thickness. Right ventricular systolic function is normal. Tricuspid regurgitation signal is inadequate for assessing PA pressure. Left Atrium: Left atrial size was normal in size. Right Atrium: Right atrial size was normal in size. Pericardium: There is no evidence of pericardial effusion. Mitral Valve: The mitral valve is grossly normal. Trivial mitral valve regurgitation. MV peak gradient, 2.8 mmHg. The mean mitral valve gradient is 1.0 mmHg. Tricuspid Valve: The tricuspid valve is grossly normal. Tricuspid valve regurgitation is trivial. Aortic Valve: The aortic valve was not well visualized. Aortic valve regurgitation is not visualized. No aortic stenosis is present. Aortic valve mean gradient measures 5.0 mmHg. Aortic valve peak gradient measures 8.2 mmHg. Aortic valve area, by VTI measures 2.42 cm.  Pulmonic Valve: The pulmonic valve was not well visualized. Pulmonic valve regurgitation is not visualized. No evidence of pulmonic stenosis. Aorta: The aortic root is normal in size and structure. Pulmonary Artery: The pulmonary artery is not well seen. Venous: The inferior vena cava was not well visualized. IAS/Shunts: No atrial level shunt detected by color flow Doppler.  LEFT VENTRICLE PLAX 2D LVIDd:         5.20 cm   Diastology LVIDs:         3.80 cm   LV e' medial:    6.31 cm/s LV PW:         1.40 cm   LV E/e' medial:  11.6 LV IVS:        1.50 cm   LV e' lateral:   12.60 cm/s LVOT diam:     2.00 cm   LV E/e' lateral: 5.8 LV SV:         81 LV SV Index:   38 LVOT Area:     3.14 cm  RIGHT VENTRICLE RV Basal diam:  3.83 cm RV Mid diam:    2.90 cm RV S prime:     12.80 cm/s RVOT diam:      3.90 cm TAPSE (M-mode): 2.9 cm LEFT ATRIUM             Index        RIGHT ATRIUM           Index LA Vol (A2C):   45.5 ml 21.49 ml/m  RA Area:     15.50 cm LA Vol (A4C):   47.3 ml 22.34 ml/m  RA Volume:   39.00 ml  18.42 ml/m LA Biplane Vol: 48.3 ml 22.81 ml/m  AORTIC VALVE                     PULMONIC VALVE AV Area (Vmax):    2.59 cm      PV Vmax:       1.56 m/s AV Area (Vmean):   2.38 cm      PV Peak grad:  9.7 mmHg AV Area (VTI):     2.42 cm AV Vmax:           143.00 cm/s AV Vmean:          104.000 cm/s AV VTI:            0.334 m AV Peak Grad:  8.2 mmHg AV Mean Grad:      5.0 mmHg LVOT Vmax:         118.00 cm/s LVOT Vmean:        78.700 cm/s LVOT VTI:          0.257 m LVOT/AV VTI ratio: 0.77  AORTA Ao Root diam: 3.40 cm MITRAL VALVE MV Area (PHT): 4.06 cm    SHUNTS MV Area VTI:   2.74 cm    Systemic VTI:  0.26 m MV Peak grad:  2.8 mmHg    Systemic Diam: 2.00 cm MV Mean grad:  1.0 mmHg    Pulmonic Diam: 3.90 cm MV Vmax:       0.84 m/s MV Vmean:      49.8 cm/s MV Decel Time: 187 msec MV E velocity: 73.00 cm/s MV A velocity: 66.20 cm/s MV E/A ratio:  1.10 Cristal [place] Lowery Electronically signed by Shawn Kendall  Lowery Signature Date/Time: 05/27/2023/3:01:27 PM    Final    CT Head Wo Contrast Result Date: 05/27/2023 CLINICAL DATA:  Follow up head CT to monitor possible SDH in posterior falx EXAM: CT HEAD WITHOUT CONTRAST TECHNIQUE: Contiguous axial images were obtained from the base of the skull through the vertex without intravenous contrast. RADIATION DOSE REDUCTION: This exam was performed according to the departmental dose-optimization program which includes automated exposure control, adjustment of the mA and/or kV according to patient size and/or use of iterative reconstruction technique. COMPARISON:  CT head 05/26/2023 FINDINGS: Brain: No substantial change in 3 mm thick subdural hemorrhage layering along the posterior falx and right tentorial leaflet. No evidence of acute large vascular territory infarct, mass lesion, midline shift or hydrocephalus. Vascular: No hyperdense vessel identified. Skull: No acute fracture. Sinuses/Orbits: Moderate paranasal sinus mucosal thickening. No acute orbital findings. Other: No mastoid effusions. IMPRESSION: 1. No substantial change in 3 mm thick subdural hemorrhage layering along the posterior falx and right tentorial leaflet. 2. New trace overlying right occipital subarachnoid hemorrhage. Electronically Signed   By: Feliberto Harts M.D.   On: 05/27/2023 02:12   DG Chest 2 View Result Date: 05/26/2023 CLINICAL DATA:  Chest pain. outside cutting wood when he suddenly fell. Pt reports waking up on the floor, doesn't remember why he fell. Denies tripping on anything. Pt endorses CP, SHOB with deep breaths, back pain. EXAM: CHEST - 2 VIEW COMPARISON:  Chest x-ray 12/24/2014 FINDINGS: The heart and mediastinal contours are within normal limits. Atherosclerotic plaque Left base atelectasis. No focal consolidation. No pulmonary edema. No pleural effusion. No pneumothorax. No acute osseous abnormality. IMPRESSION: 1. No active cardiopulmonary disease. 2.  Aortic Atherosclerosis  (ICD10-I70.0). Electronically Signed   By: Tish Frederickson M.D.   On: 05/26/2023 20:40   CT Head Wo Contrast Result Date: 05/26/2023 CLINICAL DATA:  Head trauma, minor (Age >= 65y); Neck trauma (Age >= 65y) EXAM: CT HEAD WITHOUT CONTRAST CT CERVICAL SPINE WITHOUT CONTRAST TECHNIQUE: Multidetector CT imaging of the head and cervical spine was performed following the standard protocol without intravenous contrast. Multiplanar CT image reconstructions of the cervical spine were also generated. RADIATION DOSE REDUCTION: This exam was performed according to the departmental dose-optimization program which includes automated exposure control, adjustment of the mA and/or kV according to patient size and/or use of iterative reconstruction technique. COMPARISON:  None Available. FINDINGS: CT HEAD FINDINGS Brain: Suspected small (2-3 mm thick) acute subdural hemorrhage along the posterior falx and right tentorial leaflet (for example see series 4, images 50/51). No evidence of acute large vascular  territory infarct, hydrocephalus, extra-axial collection or mass lesion/mass effect. Vascular: Calcific atherosclerosis. Skull: No acute fracture. Sinuses/Orbits: Moderate paranasal sinus mucosal thickening. No acute orbital findings. Other: No mastoid effusions. CT CERVICAL SPINE FINDINGS Alignment: Rotation of C1 on C2. No substantial sagittal subluxation. Skull base and vertebrae: No evidence of acute fracture. Sclerotic bony lesion at T3 is most likely a benign bone island in the absence of a known primary malignancy. Soft tissues and spinal canal: No prevertebral fluid or swelling. No visible canal hematoma. Disc levels: Degenerative disc disease is greatest and moderate at C6-C7. Bridging anterior osteophytes in the upper cervical spine. Upper chest: Visualized lung apices are clear. Other: Calcific atherosclerosis. IMPRESSION: CT head: Suspected small (2-3 mm thick) acute subdural hemorrhage along the posterior falx and  right tentorial leaflet. No significant mass effect. CT cervical spine: 1. No evidence of acute fracture. 2. Rotation of C1 on C2, likely positional in the absence of a fixed torticollis. Findings discussed with Dr. Anner Crete via telephone at 8:12 p.m. Electronically Signed   By: Feliberto Harts M.D.   On: 05/26/2023 20:14   CT Cervical Spine Wo Contrast Result Date: 05/26/2023 CLINICAL DATA:  Head trauma, minor (Age >= 65y); Neck trauma (Age >= 65y) EXAM: CT HEAD WITHOUT CONTRAST CT CERVICAL SPINE WITHOUT CONTRAST TECHNIQUE: Multidetector CT imaging of the head and cervical spine was performed following the standard protocol without intravenous contrast. Multiplanar CT image reconstructions of the cervical spine were also generated. RADIATION DOSE REDUCTION: This exam was performed according to the departmental dose-optimization program which includes automated exposure control, adjustment of the mA and/or kV according to patient size and/or use of iterative reconstruction technique. COMPARISON:  None Available. FINDINGS: CT HEAD FINDINGS Brain: Suspected small (2-3 mm thick) acute subdural hemorrhage along the posterior falx and right tentorial leaflet (for example see series 4, images 50/51). No evidence of acute large vascular territory infarct, hydrocephalus, extra-axial collection or mass lesion/mass effect. Vascular: Calcific atherosclerosis. Skull: No acute fracture. Sinuses/Orbits: Moderate paranasal sinus mucosal thickening. No acute orbital findings. Other: No mastoid effusions. CT CERVICAL SPINE FINDINGS Alignment: Rotation of C1 on C2. No substantial sagittal subluxation. Skull base and vertebrae: No evidence of acute fracture. Sclerotic bony lesion at T3 is most likely a benign bone island in the absence of a known primary malignancy. Soft tissues and spinal canal: No prevertebral fluid or swelling. No visible canal hematoma. Disc levels: Degenerative disc disease is greatest and moderate at C6-C7.  Bridging anterior osteophytes in the upper cervical spine. Upper chest: Visualized lung apices are clear. Other: Calcific atherosclerosis. IMPRESSION: CT head: Suspected small (2-3 mm thick) acute subdural hemorrhage along the posterior falx and right tentorial leaflet. No significant mass effect. CT cervical spine: 1. No evidence of acute fracture. 2. Rotation of C1 on C2, likely positional in the absence of a fixed torticollis. Findings discussed with Dr. Anner Crete via telephone at 8:12 p.m. Electronically Signed   By: Feliberto Harts M.D.   On: 05/26/2023 20:14      Subjective: Seen and examined on the day of discharge.  Stable no distress.  [place] discharge home.  Discharge Exam: Vitals:   05/27/23 1145 05/27/23 1342  BP:  (!) 127/59  Pulse: 69 (!) 59  Resp: 19 19  Temp:    SpO2: 97% 92%   Vitals:   05/27/23 1030 05/27/23 1100 05/27/23 1145 05/27/23 1342  BP: (!) 155/65   (!) 127/59  Pulse: 63 65 69 (!) 59  Resp: 18 15 19  19  Temp:      TempSrc:      SpO2: 95% 96% 97% 92%  Weight:      Height:        General: Pt is alert, awake, not in acute distress Cardiovascular: RRR, S1/S2 +, no rubs, no gallops Respiratory: CTA bilaterally, no wheezing, no rhonchi Abdominal: Soft, NT, ND, bowel sounds + Extremities: no edema, no cyanosis    The results of significant diagnostics from this hospitalization (including imaging, microbiology, ancillary and laboratory) are listed below for reference.     Microbiology: No results found for this or any previous visit (from the past 240 hours).   Labs: BNP (last 3 results) No results for input(s): "BNP" in the last 8760 hours. Basic Metabolic Panel: Recent Labs  Lab 05/26/23 2245 05/27/23 0145  NA 134* 138  K 4.2 3.2*  CL 96* 100  CO2 26 26  GLUCOSE 127* 146*  BUN 21 21  CREATININE 0.89 0.81  CALCIUM 9.7 8.6*   Liver Function Tests: No results for input(s): "AST", "ALT", "ALKPHOS", "BILITOT", "PROT", "ALBUMIN" in the last  168 hours. No results for input(s): "LIPASE", "AMYLASE" in the last 168 hours. No results for input(s): "AMMONIA" in the last 168 hours. CBC: Recent Labs  Lab 05/26/23 1926 05/27/23 0145  WBC 14.1* 10.3  NEUTROABS 11.4*  --   HGB 16.3 15.4  HCT 46.9 44.9  MCV 89.0 91.1  PLT 252 234   Cardiac Enzymes: No results for input(s): "CKTOTAL", "CKMB", "CKMBINDEX", "TROPONINI" in the last 168 hours. BNP: Invalid input(s): "POCBNP" CBG: No results for input(s): "GLUCAP" in the last 168 hours. D-Dimer No results for input(s): "DDIMER" in the last 72 hours. Hgb A1c No results for input(s): "HGBA1C" in the last 72 hours. Lipid Profile No results for input(s): "CHOL", "HDL", "LDLCALC", "TRIG", "CHOLHDL", "LDLDIRECT" in the last 72 hours. Thyroid function studies No results for input(s): "TSH", "T4TOTAL", "T3FREE", "THYROIDAB" in the last 72 hours.  Invalid input(s): "FREET3" Anemia work up No results for input(s): "VITAMINB12", "FOLATE", "FERRITIN", "TIBC", "IRON", "RETICCTPCT" in the last 72 hours. Urinalysis    Component Value Date/Time   COLORURINE YELLOW 12/28/2014 1738   APPEARANCEUR HAZY (A) 12/28/2014 1738   LABSPEC 1.025 12/28/2014 1738   PHURINE 5.5 12/28/2014 1738   GLUCOSEU NEGATIVE 12/28/2014 1738   HGBUR NEGATIVE 12/28/2014 1738   BILIRUBINUR NEGATIVE 12/28/2014 1738   KETONESUR NEGATIVE 12/28/2014 1738   PROTEINUR NEGATIVE 12/28/2014 1738   NITRITE NEGATIVE 12/28/2014 1738   LEUKOCYTESUR NEGATIVE 12/28/2014 1738   Sepsis Labs Recent Labs  Lab 05/26/23 1926 05/27/23 0145  WBC 14.1* 10.3   Microbiology No results found for this or any previous visit (from the past 240 hours).   Time coordinating discharge: Over 30 minutes  SIGNED:   Tresa Moore, Lowery  [facility] 05/27/2023, 4:10 PM Pager   If 7PM-7AM, please contact night-coverage

## 2023-05-27 NOTE — Assessment & Plan Note (Signed)
-   The patient will be admitted to an observation progressive unit bed. - Will check orthostatics q 12 hours. - Will obtain a bilateral carotid Doppler and 2D echo. - The patient will be gently hydrated with IV normal saline and monitored for arrhythmias. -Differential diagnoses would include neurally mediated syncope, cardiogenic, arrhythmias related,  orthostatic hypotension and less likely hypoglycemia.

## 2023-05-27 NOTE — Progress Notes (Signed)
PT Cancellation Note  Patient Details Name: Shawn Lowery MRN: [id number] DOB: 15-Jun-1945   Cancelled Treatment:    Reason Eval/Treat Not Completed: PT screened, no needs identified, will sign off (Pt reports at baseline, mobilizing independently without acute changes. Per OT, pt AMB 'community distances' during assessent earlier today. No acute PT needs indicated at this time. Will signoff.)  11:59 AM, 05/27/23 Rosamaria Lints, PT, DPT Physical Therapist - [facility]  [phone] (ASCOM)    [name] C 05/27/2023, 11:59 AM

## 2023-05-27 NOTE — Assessment & Plan Note (Addendum)
-   This is very small and did not change with repeat head CT scan. - Neurosurgery consult will be obtained. - Dr. Katrinka Blazing was notified about the patient. - We will follow neurochecks every 4 hours for 24 hours. - Will maintain BP control especially with new subarachnoid overlying hemorrhage in the occipital area.Marland Kitchen

## 2023-05-27 NOTE — Assessment & Plan Note (Signed)
-  We will continue PPI therapy

## 2023-05-27 NOTE — Evaluation (Signed)
Occupational Therapy Evaluation Patient Details Name: Shawn Lowery MRN: [id number] DOB: 06/28/1945 Today's Date: 05/27/2023   History of Present Illness Shawn Lowery is a 78 y.o. Caucasian male with medical history significant for asthma, CAD, hypertension, dyslipidemia and osteoarthritis, who presented to the emergency room with mechanical fall with occipital head injury per his report. Noncontrasted head CT scan revealed suspected small 2 to 3 mm thick acute subdural hemorrhage along the posterior falx and right tentorial leaflet with no significant mass effect. Repeat head CT 6 hours later showed no substantial change and 3 mm thick subdural hemorrhage laying along the posterior falx and right tentorial leaflet with.  It showed new trace overlying right occipital subarachnoid hemorrhage.   Clinical Impression   Shawn Lowery was seen for OT evaluation this date. Shawn Lowery reports living in a 1 level home with his spouse. He is generally independent with ADL/IADL at baseline. Had this fall while standing on a trailer using a chainsaw to cut wood. He dies additional falls history in the last 6 months. He denies functional deficits at this time and is able to perform functional mobility at a community distance without physical assistance or AD. Denies strength, sensory, or visual deficits. Endorses strong desire to go home and "figure out what happened". No further skilled OT needs identified. Will sign off at this time. Do not anticipate the need for follow up OT services upon acute hospital DC.       If plan is discharge home, recommend the following:      Functional Status Assessment  Patient has not had a recent decline in their functional status  Equipment Recommendations  None recommended by OT    Recommendations for Other Services       Precautions / Restrictions Precautions Precautions: Fall Restrictions Weight Bearing Restrictions Per Provider Order: No      Mobility Bed Mobility Overal  bed mobility: Modified Independent             General bed mobility comments: HOB elevated    Transfers Overall transfer level: Independent                        Balance Overall balance assessment: Independent, No apparent balance deficits (not formally assessed)                                         ADL either performed or assessed with clinical judgement   ADL Overall ADL's : At baseline                                       General ADL Comments: Shawn Lowery presents at or near functional baseline. He is able to perform funcional mobility at a community distance independently. Reports being up and to the room commode independently. Denies strength, sensory, visual, or cognitive deficits this date.     Vision Baseline Vision/History: 1 Wears glasses Ability to See in Adequate Light: 1 Impaired Patient Visual Report: No change from baseline       Perception         Praxis         Pertinent Vitals/Pain Pain Assessment Pain Assessment: 0-10 Pain Score: 10-Worst pain ever Pain Location: Back and chest with positional changes. Pain Descriptors / Indicators: Grimacing, Guarding, Sore  Pain Intervention(s): RN gave pain meds during session, Limited activity within patient's tolerance, Monitored during session     Extremity/Trunk Assessment Upper Extremity Assessment Upper Extremity Assessment: Overall WFL for tasks assessed   Lower Extremity Assessment Lower Extremity Assessment: Overall WFL for tasks assessed   Cervical / Trunk Assessment Cervical / Trunk Assessment: Normal   Communication Communication Communication: No apparent difficulties Cueing Techniques: Verbal cues   Cognition Arousal: Alert Behavior During Therapy: WFL for tasks assessed/performed Overall Cognitive Status: Within Functional Limits for tasks assessed                                 General Comments: Pleasant, conversational, A&O  x4     General Comments       Exercises Other Exercises Other Exercises: Shawn Lowery educated on role of OT in acute setting, safety, falls prevention.   Shoulder Instructions      Home Living Family/patient expects to be discharged to:: Private residence Living Arrangements: Spouse/significant other Available Help at Discharge: Family;Available 24 hours/day Type of Home: Mobile home Home Access: Stairs to enter Entrance Stairs-Number of Steps: 4 Entrance Stairs-Rails: Left Home Layout: One level     Bathroom Shower/Tub: Producer, television/film/video: Handicapped height     Home Equipment: Teacher, English as a foreign language (2 wheels)          Prior Functioning/Environment Prior Level of Function : Independent/Modified Independent;Driving             Mobility Comments: Independent, no AD, community ambulator ADLs Comments: Active, totally independent, does yardwork regularly.        OT Problem List: Pain;Decreased safety awareness;Decreased knowledge of use of DME or AE      OT Treatment/Interventions:      OT Goals(Current goals can be found in the care plan section) Acute Rehab OT Goals Patient Stated Goal: to go home OT Goal Formulation: All assessment and education complete, DC therapy Time For Goal Achievement: 05/27/23  OT Frequency:      Co-evaluation              AM-PAC OT "6 Clicks" Daily Activity     Outcome Measure Help from another person eating meals?: None Help from another person taking care of personal grooming?: None Help from another person toileting, which includes using toliet, bedpan, or urinal?: None Help from another person bathing (including washing, rinsing, drying)?: None Help from another person to put on and taking off regular upper body clothing?: None Help from another person to put on and taking off regular lower body clothing?: None 6 Click Score: 24   End of Session Nurse Communication: Mobility status  Activity Tolerance:  Patient tolerated treatment well Patient left: in bed;Other (comment);with family/visitor present (with hospital staff at bedside for ECHO)  OT Visit Diagnosis: History of falling (Z91.81);Pain Pain - Right/Left:  (both) Pain - part of body:  (back)                Time: 9604-5409 OT Time Calculation (min): 23 min Charges:  OT General Charges $OT Visit: 1 Visit OT Evaluation $OT Eval Low Complexity: 1 Low  Rockney Ghee, M.S., OTR/L 05/27/23, 12:10 PM

## 2023-05-27 NOTE — Consult Note (Signed)
Consulting Department:  ED  Primary Physician:  Myrene Buddy, NP  Chief Complaint:  SDH  History of Present Illness: 05/27/2023 Shawn Lowery is a 78 y.o. male who presents with the chief complaint of syncope.  He may have struck the back of his head.  He does not remember the event.  Not having any new numbness weakness or tingling.  No new headaches.  No new neurodeficits.  No new seizures.  Review of Systems:  A 10 point review of systems is negative, except for the pertinent positives and negatives detailed in the HPI.  Past Medical History: Past Medical History:  Diagnosis Date   Arthritis    Asthma    CAD (coronary artery disease)    Diverticulitis    Diverticulosis    Hyperlipemia    Hypertension    IGT (impaired glucose tolerance)    Rectal bleeding     Past Surgical History: Past Surgical History:  Procedure Laterality Date   COLONOSCOPY WITH PROPOFOL N/A 02/10/2015   Procedure: COLONOSCOPY WITH PROPOFOL;  Surgeon: Christena Deem, MD;  Location: [facility] ENDOSCOPY;  Service: Endoscopy;  Laterality: N/A;   COLONOSCOPY WITH PROPOFOL N/A 02/13/2015   Procedure: COLONOSCOPY WITH PROPOFOL;  Surgeon: Christena Deem, MD;  Location: [facility] ENDOSCOPY;  Service: Endoscopy;  Laterality: N/A;   HERNIA REPAIR      Allergies: Allergies as of 05/26/2023   (No Known Allergies)    Medications:  Current Facility-Administered Medications:    acetaminophen (TYLENOL) tablet 650 mg, 650 mg, Oral, Q6H PRN, 650 mg at 05/27/23 0919 **OR** acetaminophen (TYLENOL) suppository 650 mg, 650 mg, Rectal, Q6H PRN, Mansy, Jan A, MD   amLODipine (NORVASC) tablet 5 mg, 5 mg, Oral, Daily, Mansy, Jan A, MD, 5 mg at 05/27/23 0919   finasteride (PROSCAR) tablet 5 mg, 5 mg, Oral, Daily, Mansy, Jan A, MD, 5 mg at 05/27/23 0920   lisinopril (ZESTRIL) tablet 20 mg, 20 mg, Oral, Daily, 20 mg at 05/27/23 0919 **AND** hydrochlorothiazide (HYDRODIURIL) tablet 25 mg, 25 mg, Oral, Daily, Mansy, Jan  A, MD, 25 mg at 05/27/23 1610   magnesium hydroxide (MILK OF MAGNESIA) suspension 30 mL, 30 mL, Oral, Daily PRN, Mansy, Jan A, MD   mometasone-formoterol [facility]) 100-5 MCG/ACT inhaler 2 puff, 2 puff, Inhalation, BID, Mansy, Jan A, MD, 2 puff at 05/27/23 0920   [START ON 05/28/2023] multivitamin with minerals tablet 1 tablet, 1 tablet, Oral, Q M,W,F, Mansy, Jan A, MD   ondansetron (ZOFRAN) tablet 4 mg, 4 mg, Oral, Q6H PRN **OR** ondansetron (ZOFRAN) injection 4 mg, 4 mg, Intravenous, Q6H PRN, Mansy, Jan A, MD   oxyCODONE (Oxy IR/ROXICODONE) immediate release tablet 5-10 mg, 5-10 mg, Oral, Q4H PRN, Georgeann Oppenheim, Sudheer B, MD, 10 mg at 05/27/23 1045   pravastatin (PRAVACHOL) tablet 20 mg, 20 mg, Oral, q1800, Mansy, Jan A, MD   sodium chloride flush (NS) 0.9 % injection 3 mL, 3 mL, Intravenous, Q12H, Mansy, Jan A, MD, 3 mL at 05/27/23 9604   tamsulosin (FLOMAX) capsule 0.4 mg, 0.4 mg, Oral, QPC supper, Mansy, Jan A, MD   traZODone (DESYREL) tablet 25 mg, 25 mg, Oral, QHS PRN, Mansy, Vernetta Honey, MD  Current Outpatient Medications:    albuterol (VENTOLIN HFA) 108 (90 Base) MCG/ACT inhaler, Inhale 1 puff into the lungs every 6 (six) hours as needed for wheezing or shortness of breath., Disp: , Rfl:    amLODipine (NORVASC) 5 MG tablet, Take 5 mg by mouth daily., Disp: , Rfl:  diclofenac (VOLTAREN) 75 MG EC tablet, Take 75 mg by mouth daily as needed for mild pain (pain score 1-3)., Disp: , Rfl:    finasteride (PROSCAR) 5 MG tablet, Take 5 mg by mouth daily., Disp: , Rfl:    fluticasone (FLONASE) 50 MCG/ACT nasal spray, Place 1 spray into both nostrils daily., Disp: , Rfl:    Fluticasone-Salmeterol (ADVAIR) 100-50 MCG/DOSE AEPB, Inhale 1 puff into the lungs 2 (two) times daily as needed (for wheezing or shortness of breath). , Disp: , Rfl:    ipratropium (ATROVENT) 0.06 % nasal spray, Place 2 sprays into the nose 4 (four) times daily., Disp: , Rfl:    lisinopril-hydrochlorothiazide (PRINZIDE,ZESTORETIC) 20-25 MG  per tablet, Take 1 tablet by mouth daily., Disp: , Rfl:    lovastatin (MEVACOR) 20 MG tablet, Take 20 mg by mouth daily. , Disp: , Rfl:    Multiple Vitamin (MULTIVITAMIN WITH MINERALS) TABS tablet, Take 1 tablet by mouth every Monday, Wednesday, and Friday., Disp: , Rfl:    naproxen (NAPROSYN) 500 MG tablet, Take 1 tablet (500 mg total) by mouth 2 (two) times daily with a meal., Disp: 30 tablet, Rfl: 0   tamsulosin (FLOMAX) 0.4 MG CAPS capsule, TAKE ONE CAPSULE BY MOUTH ONCE DAILY AFTER SUPPER, Disp: 30 capsule, Rfl: 0   Social History: Social History   Tobacco Use   Smoking status: Former    Current packs/day: 1.00    Average packs/day: 1 pack/day for 15.0 years (15.0 ttl pk-yrs)    Types: Cigarettes   Smokeless tobacco: Former    Quit date: 12/31/1983  Vaping Use   Vaping status: Never Used  Substance Use Topics   Alcohol use: No   Drug use: No    Family Medical History: Family History  Problem Relation Age of Onset   Hypertension Father    CAD Father    Stroke Mother     Physical Examination: Vitals:   05/27/23 1145 05/27/23 1342  BP:  (!) 127/59  Pulse: 69 (!) 59  Resp: 19 19  Temp:    SpO2: 97% 92%     General: Patient is well developed, well nourished, calm, collected, and in no apparent distress.  NEUROLOGICAL:  General: In no acute distress.   Awake, alert, oriented to person, place, and time.  Pupils equal round and reactive to light.  Full Facial tone is symmetric.  Tongue protrusion is midline.  Bilateral upper extremities are full strength proximally and distally.  There is no pronator drift.  Language is conversant.  GCS:15   Bilateral upper and lower extremity sensation is intact to light touch.  Imaging: ECHOCARDIOGRAM COMPLETE Result Date: 05/27/2023    ECHOCARDIOGRAM REPORT   Patient Name:   Shawn Lowery Date of Exam: 05/27/2023 Medical Rec #:  [id number]    Height:       71.0 in Accession #:    [id number]   Weight:       202.0 lb Date of Birth:   June 06, 1945     BSA:          2.117 m Patient Age:    77 years     BP:           140/72 mmHg Patient Gender: M            HR:           66 bpm. Exam Location:  [facility] Procedure: 2D Echo, Cardiac Doppler and Color Doppler Indications:     Syncope  History:  Patient has no prior history of Echocardiogram examinations.                  CAD; Signs/Symptoms:Syncope.  Sonographer:     Mikki Harbor Referring Phys:  [id number] JAN A MANSY Diagnosing Phys: Yvonne Kendall MD  Sonographer Comments: Technically difficult study due to poor echo windows. Patient is unable to lie on left side due to back injury. IMPRESSIONS  1. Left ventricular ejection fraction, by estimation, is >55%. The left ventricle has normal function. Left ventricular endocardial border not optimally defined to evaluate regional wall motion. There is moderate left ventricular hypertrophy. Left ventricular diastolic parameters are consistent with Grade II diastolic dysfunction (pseudonormalization).  2. Right ventricular systolic function is normal. The right ventricular size is mildly enlarged. Tricuspid regurgitation signal is inadequate for assessing PA pressure.  3. The mitral valve is grossly normal. Trivial mitral valve regurgitation.  4. The aortic valve was not well visualized. Aortic valve regurgitation is not visualized. No aortic stenosis is present. FINDINGS  Left Ventricle: Left ventricular ejection fraction, by estimation, is >55%. The left ventricle has normal function. Left ventricular endocardial border not optimally defined to evaluate regional wall motion. The left ventricular internal cavity size was  normal in size. There is moderate left ventricular hypertrophy. Left ventricular diastolic parameters are consistent with Grade II diastolic dysfunction (pseudonormalization). Right Ventricle: The right ventricular size is mildly enlarged. No increase in right ventricular wall thickness. Right ventricular systolic function is normal.  Tricuspid regurgitation signal is inadequate for assessing PA pressure. Left Atrium: Left atrial size was normal in size. Right Atrium: Right atrial size was normal in size. Pericardium: There is no evidence of pericardial effusion. Mitral Valve: The mitral valve is grossly normal. Trivial mitral valve regurgitation. MV peak gradient, 2.8 mmHg. The mean mitral valve gradient is 1.0 mmHg. Tricuspid Valve: The tricuspid valve is grossly normal. Tricuspid valve regurgitation is trivial. Aortic Valve: The aortic valve was not well visualized. Aortic valve regurgitation is not visualized. No aortic stenosis is present. Aortic valve mean gradient measures 5.0 mmHg. Aortic valve peak gradient measures 8.2 mmHg. Aortic valve area, by VTI measures 2.42 cm. Pulmonic Valve: The pulmonic valve was not well visualized. Pulmonic valve regurgitation is not visualized. No evidence of pulmonic stenosis. Aorta: The aortic root is normal in size and structure. Pulmonary Artery: The pulmonary artery is not well seen. Venous: The inferior vena cava was not well visualized. IAS/Shunts: No atrial level shunt detected by color flow Doppler.  LEFT VENTRICLE PLAX 2D LVIDd:         5.20 cm   Diastology LVIDs:         3.80 cm   LV e' medial:    6.31 cm/s LV PW:         1.40 cm   LV E/e' medial:  11.6 LV IVS:        1.50 cm   LV e' lateral:   12.60 cm/s LVOT diam:     2.00 cm   LV E/e' lateral: 5.8 LV SV:         81 LV SV Index:   38 LVOT Area:     3.14 cm  RIGHT VENTRICLE RV Basal diam:  3.83 cm RV Mid diam:    2.90 cm RV S prime:     12.80 cm/s RVOT diam:      3.90 cm TAPSE (M-mode): 2.9 cm LEFT ATRIUM             Index  RIGHT ATRIUM           Index LA Vol (A2C):   45.5 ml 21.49 ml/m  RA Area:     15.50 cm LA Vol (A4C):   47.3 ml 22.34 ml/m  RA Volume:   39.00 ml  18.42 ml/m LA Biplane Vol: 48.3 ml 22.81 ml/m  AORTIC VALVE                     PULMONIC VALVE AV Area (Vmax):    2.59 cm      PV Vmax:       1.56 m/s AV Area (Vmean):    2.38 cm      PV Peak grad:  9.7 mmHg AV Area (VTI):     2.42 cm AV Vmax:           143.00 cm/s AV Vmean:          104.000 cm/s AV VTI:            0.334 m AV Peak Grad:      8.2 mmHg AV Mean Grad:      5.0 mmHg LVOT Vmax:         118.00 cm/s LVOT Vmean:        78.700 cm/s LVOT VTI:          0.257 m LVOT/AV VTI ratio: 0.77  AORTA Ao Root diam: 3.40 cm MITRAL VALVE MV Area (PHT): 4.06 cm    SHUNTS MV Area VTI:   2.74 cm    Systemic VTI:  0.26 m MV Peak grad:  2.8 mmHg    Systemic Diam: 2.00 cm MV Mean grad:  1.0 mmHg    Pulmonic Diam: 3.90 cm MV Vmax:       0.84 m/s MV Vmean:      49.8 cm/s MV Decel Time: 187 msec MV E velocity: 73.00 cm/s MV A velocity: 66.20 cm/s MV E/A ratio:  1.10 Cristal Deer End MD Electronically signed by Yvonne Kendall MD Signature Date/Time: 05/27/2023/3:01:27 PM    Final    CT Head Wo Contrast Result Date: 05/27/2023 CLINICAL DATA:  Follow up head CT to monitor possible SDH in posterior falx EXAM: CT HEAD WITHOUT CONTRAST TECHNIQUE: Contiguous axial images were obtained from the base of the skull through the vertex without intravenous contrast. RADIATION DOSE REDUCTION: This exam was performed according to the departmental dose-optimization program which includes automated exposure control, adjustment of the mA and/or kV according to patient size and/or use of iterative reconstruction technique. COMPARISON:  CT head 05/26/2023 FINDINGS: Brain: No substantial change in 3 mm thick subdural hemorrhage layering along the posterior falx and right tentorial leaflet. No evidence of acute large vascular territory infarct, mass lesion, midline shift or hydrocephalus. Vascular: No hyperdense vessel identified. Skull: No acute fracture. Sinuses/Orbits: Moderate paranasal sinus mucosal thickening. No acute orbital findings. Other: No mastoid effusions. IMPRESSION: 1. No substantial change in 3 mm thick subdural hemorrhage layering along the posterior falx and right tentorial leaflet. 2. New trace  overlying right occipital subarachnoid hemorrhage. Electronically Signed   By: Feliberto Harts M.D.   On: 05/27/2023 02:12   DG Chest 2 View Result Date: 05/26/2023 CLINICAL DATA:  Chest pain. outside cutting wood when he suddenly fell. Pt reports waking up on the floor, doesn't remember why he fell. Denies tripping on anything. Pt endorses CP, SHOB with deep breaths, back pain. EXAM: CHEST - 2 VIEW COMPARISON:  Chest x-ray 12/24/2014 FINDINGS: The heart and mediastinal contours are within  normal limits. Atherosclerotic plaque Left base atelectasis. No focal consolidation. No pulmonary edema. No pleural effusion. No pneumothorax. No acute osseous abnormality. IMPRESSION: 1. No active cardiopulmonary disease. 2.  Aortic Atherosclerosis (ICD10-I70.0). Electronically Signed   By: Tish Frederickson M.D.   On: 05/26/2023 20:40   CT Head Wo Contrast Result Date: 05/26/2023 CLINICAL DATA:  Head trauma, minor (Age >= 65y); Neck trauma (Age >= 65y) EXAM: CT HEAD WITHOUT CONTRAST CT CERVICAL SPINE WITHOUT CONTRAST TECHNIQUE: Multidetector CT imaging of the head and cervical spine was performed following the standard protocol without intravenous contrast. Multiplanar CT image reconstructions of the cervical spine were also generated. RADIATION DOSE REDUCTION: This exam was performed according to the departmental dose-optimization program which includes automated exposure control, adjustment of the mA and/or kV according to patient size and/or use of iterative reconstruction technique. COMPARISON:  None Available. FINDINGS: CT HEAD FINDINGS Brain: Suspected small (2-3 mm thick) acute subdural hemorrhage along the posterior falx and right tentorial leaflet (for example see series 4, images 50/51). No evidence of acute large vascular territory infarct, hydrocephalus, extra-axial collection or mass lesion/mass effect. Vascular: Calcific atherosclerosis. Skull: No acute fracture. Sinuses/Orbits: Moderate paranasal sinus  mucosal thickening. No acute orbital findings. Other: No mastoid effusions. CT CERVICAL SPINE FINDINGS Alignment: Rotation of C1 on C2. No substantial sagittal subluxation. Skull base and vertebrae: No evidence of acute fracture. Sclerotic bony lesion at T3 is most likely a benign bone island in the absence of a known primary malignancy. Soft tissues and spinal canal: No prevertebral fluid or swelling. No visible canal hematoma. Disc levels: Degenerative disc disease is greatest and moderate at C6-C7. Bridging anterior osteophytes in the upper cervical spine. Upper chest: Visualized lung apices are clear. Other: Calcific atherosclerosis. IMPRESSION: CT head: Suspected small (2-3 mm thick) acute subdural hemorrhage along the posterior falx and right tentorial leaflet. No significant mass effect. CT cervical spine: 1. No evidence of acute fracture. 2. Rotation of C1 on C2, likely positional in the absence of a fixed torticollis. Findings discussed with Dr. Anner Crete via telephone at 8:12 p.m. Electronically Signed   By: Feliberto Harts M.D.   On: 05/26/2023 20:14   CT Cervical Spine Wo Contrast Result Date: 05/26/2023 CLINICAL DATA:  Head trauma, minor (Age >= 65y); Neck trauma (Age >= 65y) EXAM: CT HEAD WITHOUT CONTRAST CT CERVICAL SPINE WITHOUT CONTRAST TECHNIQUE: Multidetector CT imaging of the head and cervical spine was performed following the standard protocol without intravenous contrast. Multiplanar CT image reconstructions of the cervical spine were also generated. RADIATION DOSE REDUCTION: This exam was performed according to the departmental dose-optimization program which includes automated exposure control, adjustment of the mA and/or kV according to patient size and/or use of iterative reconstruction technique. COMPARISON:  None Available. FINDINGS: CT HEAD FINDINGS Brain: Suspected small (2-3 mm thick) acute subdural hemorrhage along the posterior falx and right tentorial leaflet (for example see  series 4, images 50/51). No evidence of acute large vascular territory infarct, hydrocephalus, extra-axial collection or mass lesion/mass effect. Vascular: Calcific atherosclerosis. Skull: No acute fracture. Sinuses/Orbits: Moderate paranasal sinus mucosal thickening. No acute orbital findings. Other: No mastoid effusions. CT CERVICAL SPINE FINDINGS Alignment: Rotation of C1 on C2. No substantial sagittal subluxation. Skull base and vertebrae: No evidence of acute fracture. Sclerotic bony lesion at T3 is most likely a benign bone island in the absence of a known primary malignancy. Soft tissues and spinal canal: No prevertebral fluid or swelling. No visible canal hematoma. Disc levels: Degenerative disc disease is  greatest and moderate at C6-C7. Bridging anterior osteophytes in the upper cervical spine. Upper chest: Visualized lung apices are clear. Other: Calcific atherosclerosis. IMPRESSION: CT head: Suspected small (2-3 mm thick) acute subdural hemorrhage along the posterior falx and right tentorial leaflet. No significant mass effect. CT cervical spine: 1. No evidence of acute fracture. 2. Rotation of C1 on C2, likely positional in the absence of a fixed torticollis. Findings discussed with Dr. Anner Crete via telephone at 8:12 p.m. Electronically Signed   By: Feliberto Harts M.D.   On: 05/26/2023 20:14     I have personally reviewed the images and agree with the above interpretation.  Labs:    Latest Ref Rng & Units 05/27/2023    1:45 AM 05/26/2023    7:26 PM 01/01/2015    4:54 AM  CBC  WBC 4.0 - 10.5 K/uL 10.3  14.1  8.8   Hemoglobin 13.0 - 17.0 g/dL 30.8  65.7  7.6   Hematocrit 39.0 - 52.0 % 44.9  46.9  22.6   Platelets 150 - 400 K/uL 234  252  334       Latest Ref Rng & Units 05/27/2023    1:45 AM 05/26/2023   10:45 PM 01/01/2015    4:54 AM  BMP  Glucose 70 - 99 mg/dL 846  962  952   BUN 8 - 23 mg/dL 21  21  14    Creatinine 0.61 - 1.24 mg/dL 8.41  3.24  4.01   Sodium 135 - 145 mmol/L 138  134   138   Potassium 3.5 - 5.1 mmol/L 3.2  4.2  3.8   Chloride 98 - 111 mmol/L 100  96  103   CO2 22 - 32 mmol/L 26  26  28    Calcium 8.9 - 10.3 mg/dL 8.6  9.7  8.5         Assessment and Plan: Mr. Seto is a pleasant 78 y.o. male with a recent syncopal episode.  He thinks that he struck the back of his head.  He was found to have a skin subdural hematoma with no evidence of expansion on repeat scan.  Currently not having any neurologic symptoms.  No neurologic deficits.  On physical exam no localizing signs or symptoms.  On imaging shows a small subdural hematoma that appears to be stable on repeat imaging.  At this point we can follow-up in neurosurgery clinic in 3 to 4 weeks with repeat head CT to evaluate for any expansion.  At this time there is no intervention necessary.    Lovenia Kim, MD/MSCR Dept. of Neurosurgery

## 2023-05-27 NOTE — Telephone Encounter (Signed)
Per Dr.Smith :  follow up in 3-4 weeks with head ct, okay for PA clinic   Please advise once completed

## 2023-05-27 NOTE — ED Notes (Signed)
Pt up to use restroom, pt able to stand and ambulate without assistance. One person standby

## 2023-05-28 NOTE — Telephone Encounter (Signed)
Left message to call back

## 2023-05-29 NOTE — Telephone Encounter (Signed)
06/12/2023 CT 06/16/2023 office visit

## 2023-05-30 DIAGNOSIS — E782 Mixed hyperlipidemia: Secondary | ICD-10-CM | POA: Diagnosis not present

## 2023-05-30 DIAGNOSIS — R55 Syncope and collapse: Secondary | ICD-10-CM | POA: Diagnosis not present

## 2023-05-30 DIAGNOSIS — I2510 Atherosclerotic heart disease of native coronary artery without angina pectoris: Secondary | ICD-10-CM | POA: Diagnosis not present

## 2023-05-30 DIAGNOSIS — I10 Essential (primary) hypertension: Secondary | ICD-10-CM | POA: Diagnosis not present

## 2023-05-30 DIAGNOSIS — Z09 Encounter for follow-up examination after completed treatment for conditions other than malignant neoplasm: Secondary | ICD-10-CM | POA: Diagnosis not present

## 2023-05-30 DIAGNOSIS — I480 Paroxysmal atrial fibrillation: Secondary | ICD-10-CM | POA: Diagnosis not present

## 2023-05-30 DIAGNOSIS — Z79899 Other long term (current) drug therapy: Secondary | ICD-10-CM | POA: Diagnosis not present

## 2023-06-12 ENCOUNTER — Ambulatory Visit
Admission: RE | Admit: 2023-06-12 | Discharge: 2023-06-12 | Disposition: A | Discharge status: Home / Self Care | Payer: PPO | Source: Ambulatory Visit | Attending: Neurosurgery | Admitting: Neurosurgery

## 2023-06-12 DIAGNOSIS — S065XAA Traumatic subdural hemorrhage with loss of consciousness status unknown, initial encounter: Secondary | ICD-10-CM | POA: Diagnosis not present

## 2023-06-12 DIAGNOSIS — I629 Nontraumatic intracranial hemorrhage, unspecified: Secondary | ICD-10-CM | POA: Diagnosis not present

## 2023-06-16 ENCOUNTER — Ambulatory Visit: Payer: PPO | Admitting: Physician Assistant

## 2023-06-16 ENCOUNTER — Encounter: Payer: Self-pay | Admitting: Physician Assistant

## 2023-06-16 VITALS — BP 160/60 | Ht 71.0 in | Wt 210.6 lb

## 2023-06-16 DIAGNOSIS — R9089 Other abnormal findings on diagnostic imaging of central nervous system: Secondary | ICD-10-CM

## 2023-06-16 DIAGNOSIS — S065XAD Traumatic subdural hemorrhage with loss of consciousness status unknown, subsequent encounter: Secondary | ICD-10-CM | POA: Diagnosis not present

## 2023-06-16 DIAGNOSIS — S065XAA Traumatic subdural hemorrhage with loss of consciousness status unknown, initial encounter: Secondary | ICD-10-CM

## 2023-06-16 NOTE — Progress Notes (Signed)
   Progress Note: Referring Physician:  No referring provider defined for this encounter.  Primary Physician:  Myrene Buddy, NP  Chief Complaint: Subdural hematoma  History of Present Illness: Shawn Lowery is a 78 y.o. male who presents approximately 3 weeks status post syncope resulting in striking the back of his head and a subdural hematoma.  This was stable on repeat imaging.  Currently he states that he has no headaches, numbness and tingling, weakness, changes to vision or hearing, changes to bowel and bladder function.    Exam: Today's Vitals   06/16/23 1307  BP: (!) 160/60  Weight: 210 lb 9.6 oz (95.5 kg)  Height: 5\' 11"  (1.803 m)  PainSc: 7   PainLoc: Generalized   Body mass index is 29.37 kg/m.    NEUROLOGICAL:  General: In no acute distress.   Awake, alert, oriented to person, place, and time.  Pupils equal round and reactive to light.  Facial tone is symmetric.   There is no pronator drift.  Language is conversant.   GCS:15   Strength: Side Biceps Triceps Deltoid Interossei Grip Wrist Ext. Wrist Flex.  R 5 5 5 5 5 5 5   L 5 5 5 5 5 5 5      Imaging: CT head without contrast on 06/12/23:  Formal read not yet available from radiology however area of bleeding does appear improved.  I have personally reviewed the images and agree with the above interpretation.  Assessment and Plan: Shawn Lowery is a pleasant 78 y.o. male who presents approximately 3 weeks status post syncope resulting in striking the back of his head and a subdural hematoma.  This was stable on repeat imaging.  Currently he states that he has no headaches, numbness and tingling, weakness, changes to vision or hearing, changes to bowel and bladder function.Pupils equal round and reactive to light.  Facial tone is symmetric.   There is no pronator drift.  Language is conversant.Formal read not yet available from radiology on most recent repeat head CT however area of bleeding does appear  improved.    Overall, patient is doing well.  Again, we will wait for formal radiology read but this time it does seem improved.  He is experiencing no symptoms at this time.  Plan for no scheduled repeat imaging unless there to be a change.  Red flag symptoms were reviewed with patient and his wife at length.  If he were to experience any of these he was instructed to go to the emergency department immediately.  He was encouraged to reach out to me for questions or concerns he has in the future.  Joan Flores PA-C Neurosurgery

## 2023-06-23 DIAGNOSIS — J3489 Other specified disorders of nose and nasal sinuses: Secondary | ICD-10-CM | POA: Diagnosis not present

## 2023-06-23 DIAGNOSIS — J330 Polyp of nasal cavity: Secondary | ICD-10-CM | POA: Diagnosis not present

## 2023-06-23 DIAGNOSIS — J342 Deviated nasal septum: Secondary | ICD-10-CM | POA: Diagnosis not present

## 2023-07-01 ENCOUNTER — Encounter: Payer: Self-pay | Admitting: Neurosurgery

## 2023-09-12 DIAGNOSIS — E782 Mixed hyperlipidemia: Secondary | ICD-10-CM | POA: Diagnosis not present

## 2023-09-12 DIAGNOSIS — Z79899 Other long term (current) drug therapy: Secondary | ICD-10-CM | POA: Diagnosis not present

## 2023-09-12 DIAGNOSIS — R7302 Impaired glucose tolerance (oral): Secondary | ICD-10-CM | POA: Diagnosis not present

## 2023-09-12 DIAGNOSIS — I2510 Atherosclerotic heart disease of native coronary artery without angina pectoris: Secondary | ICD-10-CM | POA: Diagnosis not present

## 2023-09-12 DIAGNOSIS — I10 Essential (primary) hypertension: Secondary | ICD-10-CM | POA: Diagnosis not present

## 2023-09-12 DIAGNOSIS — I480 Paroxysmal atrial fibrillation: Secondary | ICD-10-CM | POA: Diagnosis not present

## 2023-09-12 DIAGNOSIS — E038 Other specified hypothyroidism: Secondary | ICD-10-CM | POA: Diagnosis not present

## 2023-09-12 DIAGNOSIS — N400 Enlarged prostate without lower urinary tract symptoms: Secondary | ICD-10-CM | POA: Diagnosis not present

## 2023-09-12 DIAGNOSIS — J4520 Mild intermittent asthma, uncomplicated: Secondary | ICD-10-CM | POA: Diagnosis not present

## 2023-10-28 DIAGNOSIS — H2513 Age-related nuclear cataract, bilateral: Secondary | ICD-10-CM | POA: Diagnosis not present

## 2023-10-28 DIAGNOSIS — H353231 Exudative age-related macular degeneration, bilateral, with active choroidal neovascularization: Secondary | ICD-10-CM | POA: Diagnosis not present

## 2023-10-28 DIAGNOSIS — H353121 Nonexudative age-related macular degeneration, left eye, early dry stage: Secondary | ICD-10-CM | POA: Diagnosis not present

## 2023-11-13 DIAGNOSIS — D492 Neoplasm of unspecified behavior of bone, soft tissue, and skin: Secondary | ICD-10-CM | POA: Diagnosis not present

## 2023-11-13 DIAGNOSIS — L578 Other skin changes due to chronic exposure to nonionizing radiation: Secondary | ICD-10-CM | POA: Diagnosis not present

## 2023-11-13 DIAGNOSIS — L218 Other seborrheic dermatitis: Secondary | ICD-10-CM | POA: Diagnosis not present

## 2023-11-13 DIAGNOSIS — Z86018 Personal history of other benign neoplasm: Secondary | ICD-10-CM | POA: Diagnosis not present

## 2023-11-13 DIAGNOSIS — Z872 Personal history of diseases of the skin and subcutaneous tissue: Secondary | ICD-10-CM | POA: Diagnosis not present

## 2023-11-13 DIAGNOSIS — Z85828 Personal history of other malignant neoplasm of skin: Secondary | ICD-10-CM | POA: Diagnosis not present

## 2023-11-13 DIAGNOSIS — L570 Actinic keratosis: Secondary | ICD-10-CM | POA: Diagnosis not present

## 2024-02-11 DIAGNOSIS — J4520 Mild intermittent asthma, uncomplicated: Secondary | ICD-10-CM | POA: Diagnosis not present

## 2024-02-11 DIAGNOSIS — R051 Acute cough: Secondary | ICD-10-CM | POA: Diagnosis not present

## 2024-03-16 DIAGNOSIS — R7302 Impaired glucose tolerance (oral): Secondary | ICD-10-CM | POA: Diagnosis not present

## 2024-03-16 DIAGNOSIS — I10 Essential (primary) hypertension: Secondary | ICD-10-CM | POA: Diagnosis not present

## 2024-03-16 DIAGNOSIS — J4520 Mild intermittent asthma, uncomplicated: Secondary | ICD-10-CM | POA: Diagnosis not present

## 2024-03-16 DIAGNOSIS — N400 Enlarged prostate without lower urinary tract symptoms: Secondary | ICD-10-CM | POA: Diagnosis not present

## 2024-03-16 DIAGNOSIS — E782 Mixed hyperlipidemia: Secondary | ICD-10-CM | POA: Diagnosis not present

## 2024-03-16 DIAGNOSIS — I2510 Atherosclerotic heart disease of native coronary artery without angina pectoris: Secondary | ICD-10-CM | POA: Diagnosis not present

## 2024-03-16 DIAGNOSIS — Z79899 Other long term (current) drug therapy: Secondary | ICD-10-CM | POA: Diagnosis not present

## 2024-03-16 DIAGNOSIS — E038 Other specified hypothyroidism: Secondary | ICD-10-CM | POA: Diagnosis not present

## 2024-03-16 DIAGNOSIS — Z1331 Encounter for screening for depression: Secondary | ICD-10-CM | POA: Diagnosis not present

## 2024-03-16 DIAGNOSIS — I480 Paroxysmal atrial fibrillation: Secondary | ICD-10-CM | POA: Diagnosis not present

## 2024-03-16 DIAGNOSIS — Z0000 Encounter for general adult medical examination without abnormal findings: Secondary | ICD-10-CM | POA: Diagnosis not present

## 2024-05-04 NOTE — Progress Notes (Signed)
 Shawn Lowery                                          MRN: [id number]   05/04/2024   The [facility] Quality Team Specialist reviewed this patient medical record for the purposes of chart review for care gap closure. The following were reviewed: abstraction for care gap closure-controlling blood pressure.    [facility] Quality Team
